# Patient Record
Sex: Female | Born: 1960 | Race: White | Hispanic: No | Marital: Married | Smoking: Never smoker
Health system: Western US, Academic
[De-identification: ages and names within clinical notes are randomized; demographics above are authoritative.]

## PROBLEM LIST (undated history)

## (undated) DIAGNOSIS — E079 Disorder of thyroid, unspecified: Secondary | ICD-10-CM

## (undated) DIAGNOSIS — E119 Type 2 diabetes mellitus without complications: Secondary | ICD-10-CM

## (undated) DIAGNOSIS — R42 Dizziness and giddiness: Secondary | ICD-10-CM

## (undated) HISTORY — PX: HYSTERECTOMY, ABDOMINAL, OPEN: SHX000826

---

## 2015-09-10 ENCOUNTER — Inpatient Hospital Stay
Admission: EM | Admit: 2015-09-10 | Discharge: 2015-09-12 | DRG: 251 | Disposition: A | Discharge status: Home / Self Care | Payer: No Typology Code available for payment source | Attending: Surgery | Admitting: Surgery

## 2015-09-10 DIAGNOSIS — E119 Type 2 diabetes mellitus without complications: Secondary | ICD-10-CM | POA: Diagnosis present

## 2015-09-10 DIAGNOSIS — K819 Cholecystitis, unspecified: Secondary | ICD-10-CM | POA: Insufficient documentation

## 2015-09-10 DIAGNOSIS — G4733 Obstructive sleep apnea (adult) (pediatric): Secondary | ICD-10-CM | POA: Diagnosis present

## 2015-09-10 DIAGNOSIS — R1011 Right upper quadrant pain: Principal | ICD-10-CM | POA: Diagnosis present

## 2015-09-10 DIAGNOSIS — Z6830 Body mass index (BMI) 30.0-30.9, adult: Secondary | ICD-10-CM | POA: Insufficient documentation

## 2015-09-10 DIAGNOSIS — E669 Obesity, unspecified: Secondary | ICD-10-CM | POA: Diagnosis present

## 2015-09-10 DIAGNOSIS — Z90710 Acquired absence of both cervix and uterus: Secondary | ICD-10-CM | POA: Insufficient documentation

## 2015-09-10 LAB — BASIC METABOLIC PANEL
CALCIUM: 9.6 mg/dL (ref 8.6–10.5)
CARBON DIOXIDE TOTAL: 20 meq/L — AB (ref 24–32)
CHLORIDE: 108 meq/L (ref 95–110)
CREATININE BLOOD: 0.88 mg/dL (ref 0.44–1.27)
GLUCOSE: 134 mg/dL — AB (ref 70–99)
POTASSIUM: 3.7 meq/L (ref 3.3–5.0)
SODIUM: 137 meq/L (ref 135–145)
UREA NITROGEN, BLOOD (BUN): 18 mg/dL (ref 8–22)

## 2015-09-10 LAB — HEPATIC FUNCTION PANEL
ALANINE TRANSFERASE (ALT): 28 U/L (ref 5–54)
ALBUMIN: 4 g/dL (ref 3.4–4.8)
ALKALINE PHOSPHATASE (ALP): 66 U/L (ref 35–115)
ASPARTATE TRANSAMINASE (AST): 22 U/L (ref 15–43)
BILIRUBIN TOTAL: 0.4 mg/dL (ref 0.3–1.3)
PROTEIN: 7.2 g/dL (ref 6.3–8.3)

## 2015-09-10 LAB — CBC WITH DIFFERENTIAL
BASOPHILS % AUTO: 1.2 %
BASOPHILS ABS AUTO: 0.1 10*3/uL (ref 0–0.2)
EOSINOPHIL % AUTO: 1.9 %
EOSINOPHIL ABS AUTO: 0.2 10*3/uL (ref 0–0.5)
HEMATOCRIT: 41.2 % (ref 36–46)
HEMOGLOBIN: 14.1 g/dL (ref 12.0–16.0)
LYMPHOCYTE ABS AUTO: 4 10*3/uL (ref 1.0–4.8)
LYMPHOCYTES % AUTO: 43.5 %
MCH: 29.6 pg (ref 27–33)
MCHC: 34.1 % (ref 32–36)
MCV: 86.7 UM3 (ref 80–100)
MONOCYTES % AUTO: 7.4 %
MONOCYTES ABS AUTO: 0.7 10*3/uL (ref 0.1–0.8)
MPV: 8.4 UM3 (ref 6.8–10.0)
NEUTROPHIL ABS AUTO: 4.2 10*3/uL (ref 1.80–7.70)
NEUTROPHILS % AUTO: 46 %
PLATELET COUNT: 314 10*3/uL (ref 130–400)
RDW: 13.9 U (ref 0–14.7)
RED CELL COUNT: 4.75 10*6/uL (ref 4.0–5.2)
WHITE BLOOD CELL COUNT: 9.2 10*3/uL (ref 4.5–11.0)

## 2015-09-10 LAB — LIPASE: LIPASE: 28 U/L (ref 13–51)

## 2015-09-10 NOTE — ED Triage Note (Signed)
Pt presents to ED with complaints of epigastric abd pain with nausea since yesterday. Pt reports pain is getting worse today. Primary [place]ian speaking, here with daughter that is translating. Pt denies bowel or bladder dysfunction. Pt states that the pain is worse after eating. Reports pain 8/10.

## 2015-09-10 NOTE — ED Initial Note (Signed)
EMERGENCY DEPARTMENT PHYSICIAN NOTE - Michele Chung       Date of Service:   09/10/2015 10:16 PM Patient's PCP: Michele Chung   Note Started: 09/10/2015 23:56 DOB: Sep 24, 1960             Chief Complaint   Patient presents with    Abd Pain Active       The history provided by the patient.  Interpreter used: Yes Other- daughter    Michele Chung is a 55yr old female, with a past medical history significant for DM, arrhythmia, hx hysterectomy, who presents to the ED with a chief complaint of abdominal pain that began yesterday. The patient reports sudden onset of abdominal pain yesterday, which returned today. Pain is gradually worsening, constant, epigastric, radiating to R side, worse with eating, better with nothing. Complains of associated nausea and slight constipation. Denies fever, chills, vomiting, cough, shortness of breath, dysuria. Takes Atorvastatin, not on diabetes medication as she is managing her diet.     A full history, including pertinent past medical and social history was reviewed.    HISTORY:  1    *Cholecystitis     No Known Allergies   No past medical history on file. Past Surgical History:  No date: Hysterectomy, abdominal, open   Social History    Marital status: MARRIED             Spouse name:                       Years of education:                 Number of children:               Occupational History    None on file    Social History Main Topics    Smoking status: Not on file                          Smokeless status: Not on file                       Alcohol use: Not on file     Drug use: Not on file     Sexual activity: Not on file          Other Topics            Concern    None on file    Social History Narrative    None on file     No family history on file.             Review of Systems   Constitutional: Negative for chills and fever.   HENT: Negative for rhinorrhea and sore throat.    Eyes: Negative for visual disturbance.   Respiratory: Negative for cough and shortness  of breath.    Cardiovascular: Negative for chest pain and leg swelling.   Gastrointestinal: Positive for abdominal pain, constipation and nausea. Negative for diarrhea and vomiting.   Genitourinary: Negative for dysuria.   Musculoskeletal: Negative for back pain.   Skin: Negative for rash.   Neurological: Negative for headaches.   All other systems reviewed and are negative.      TRIAGE VITAL SIGNS:  Temp: 36.7 C (98.1 F) (09/10/15 2220)  Temp src: Oral (09/10/15 2220)  Pulse: 102 (09/10/15 2220)  BP: (!) 177/110 (09/10/15 2220)  Resp: 18 (09/10/15 2220)  SpO2:  99 % (09/10/15 2220)  Weight: 87.8 kg (193 lb 9 oz) (09/10/15 2216)    Physical Exam   Constitutional: She is oriented to person, place, and time. She appears well-developed and well-nourished. No distress.   HENT:   Head: Normocephalic and atraumatic.   Mouth/Throat: Oropharynx is clear and moist.   Eyes: EOM are normal. Pupils are equal, round, and reactive to light.   Neck: Normal range of motion. Neck supple.   Cardiovascular: Normal rate, regular rhythm, normal heart sounds and intact distal pulses.  Exam reveals no gallop and no friction rub.    No murmur heard.  Pulmonary/Chest: Effort normal and breath sounds normal. She has no wheezes. She has no rales.   Abdominal: Soft. Bowel sounds are normal. There is tenderness in the right upper quadrant.   Musculoskeletal: Normal range of motion. She exhibits no edema.   Neurological: She is alert and oriented to person, place, and time.   Skin: Skin is warm and dry. She is not diaphoretic.   Nursing note and vitals reviewed.    INITIAL ASSESSMENT & PLAN, MEDICAL DECISION MAKING, ED COURSE  Michele Chung is a 55yr female who presents with a chief complaint of abdominal pain.     Differential includes, but is not limited to: gallstone, cholecystitis, pyelonephritis, gastritis    The results of the ED evaluation were notable for the following:    Pertinent lab results:   Labs Reviewed   BASIC METABOLIC PANEL  - Abnormal; Notable for the following:        Result Value    CARBON DIOXIDE TOTAL 20 (*)     GLUCOSE 134 (*)     All other components within normal limits   CBC WITH DIFFERENTIAL   HEPATIC FUNCTION PANEL   LIPASE   URINALYSIS AND CULTURE IF IND   CULTURE SURVEILLANCE, MRSA   POC GLUCOSE       Pertinent imaging results (reviewed and interpreted independently by me):   US ABDOMEN, LIMITED  CT ABDOMEN + PELVIS WITH CONTRAST    Impression: suspicious for cholecystitis on CT scan    Radiology reads:   US ABDOMEN, LIMITED  Diffusely echogenic liver consistent with hepatic steatosis and/or  intrinsic liver disease.  No evidence of cholelithiasis or cholecystitis.    CT ABDOMEN + PELVIS WITH CONTRAST    Diffuse gallbladder wall thickening with mild pericholecystic stranding.  Findings raise concern for cholecystitis though the ultrasound findings  were equivocal. Consider further evaluation with HIDA.    Medications:   Medications   Docusate (COLACE) Capsule 100 mg (not administered)   Acetaminophen (TYLENOL) Tablet 650 mg (not administered)   Ondansetron (ZOFRAN) Injection 4 mg (not administered)   Hydromorphone (DILAUDID) Injection 0.2-0.6 mg (not administered)   Ceftriaxone (ROCEPHIN) 2 g in NaCl 0.9% 100 mL Mini-Bag Plus IVPB (not administered)   Metronidazole (FLAGYL) 500 mg in Iso-Osmotic NaCl 100 mL IVPB (not administered)   D5 / 0.45% NaCl w/ Potassium Chloride 20 mEq/L Infusion (not administered)   Glucose Chewable Tablet 12 g (not administered)   Glucose Chewable Tablet 24 g (not administered)   Dextrose 50% Injection 12.5 g (not administered)   Dextrose 50% Injection 25 g (not administered)   Glucagon Injection 1 mg (not administered)   Insulin Regular Injection 1-6 Units (not administered)   Fentanyl (SUBLIMAZE) Injection 50 mcg (50 mcg IV Given 09/11/15 0042)   NaCl 0.9% Bolus 1,000 mL (0 mL IV Stopped 09/11/15 0230)   Iohexol (OMNIPAQUE) 350 mg/mL  Injection 125 mL (125 mL IV Given 09/11/15 0256)     Consults: A  consult was obtained from the ACS service to evaluate for cholecystitis. They recommend admission.        Chart Review: I reviewed the patient's prior medical records. Pertinent information that is relevant to this encounter includes n/a.    Patient Summary: 55 yo female presenting with RUQ pain and nausea. Hypertensive but otherwise with normal vital signs. Labs unremarkable. CT suspicious for cholecystitis. Treated with ivf and pain medication. Admitted to [facility] for further care. Hemodynamically stable in ED.    LAST VITAL SIGNS:  Temp: 36.9 C (98.4 F) (06/19 0522)  Temp src: Oral (06/19 0522)  Pulse: 82 (06/19 0522)  BP: 144/101 (06/19 0522)  Resp: 18 (06/18 2220)  SpO2: 98 % (06/19 0522)  Height: 170.2 cm ( ) (06/18 2216)  Weight: 87.8 kg (193 lb 9 oz) (06/18 2216)     Clinical Impression:     ICD-10-CM    1. Cholecystitis K81.9 ADMIT TO INPATIENT     ADMIT TO INPATIENT       Disposition: Admit    PATIENT'S GENERAL CONDITION:  Fair: Vital signs are stable and within normal limits. Patient is conscious but may be uncomfortable. Indicators are favorable.    SCRIBE STATEMENT  I, Anne Pywell, SCRIBE,  am personally taking down the notes in the presence of Dr. Vickki Hearing Olafsdottir-Gardali.  Electronically signed by Thurston Hole Pywell, SCRIBE, Scribe  09/11/2015  00:01      SCRIBE DISCLAIMER   I, Oralia Rud, personally performed the services described in this documentation, as scribed by the trained medical scribe above in my presence, and it is both accurate and complete.     Electronically signed by: Oralia Rud, Resident      This patient was seen, evaluated, and care plan was developed with the resident.  I agree with the findings and plan as outlined in our combined note. I personally independently visualized the images and tracings as noted above.      Romona Curls, MD      Electronically signed by: Romona Curls, MD, Attending Physician

## 2015-09-11 ENCOUNTER — Emergency Department (EMERGENCY_DEPARTMENT_HOSPITAL): Payer: No Typology Code available for payment source

## 2015-09-11 ENCOUNTER — Inpatient Hospital Stay (EMERGENCY_DEPARTMENT_HOSPITAL): Payer: No Typology Code available for payment source

## 2015-09-11 DIAGNOSIS — I10 Essential (primary) hypertension: Secondary | ICD-10-CM

## 2015-09-11 DIAGNOSIS — K760 Fatty (change of) liver, not elsewhere classified: Secondary | ICD-10-CM

## 2015-09-11 DIAGNOSIS — R938 Abnormal findings on diagnostic imaging of other specified body structures: Secondary | ICD-10-CM

## 2015-09-11 DIAGNOSIS — R1011 Right upper quadrant pain: Secondary | ICD-10-CM

## 2015-09-11 DIAGNOSIS — G4733 Obstructive sleep apnea (adult) (pediatric): Secondary | ICD-10-CM

## 2015-09-11 DIAGNOSIS — K819 Cholecystitis, unspecified: Secondary | ICD-10-CM | POA: Insufficient documentation

## 2015-09-11 LAB — URINALYSIS AND CULTURE IF IND
BILIRUBIN URINE: NEGATIVE
GLUCOSE URINE: NEGATIVE mg/dL
KETONES: NEGATIVE mg/dL
LEUK. ESTERASE: NEGATIVE
NITRITE URINE: NEGATIVE
OCCULT BLOOD URINE: NEGATIVE mg/dL
PH URINE: 5.5 (ref 4.8–7.8)
PROTEIN URINE: NEGATIVE mg/dL
SPECIFIC GRAVITY: 1.013 (ref 1.002–1.030)
UROBILINOGEN.: NEGATIVE mg/dL (ref ?–2.0)

## 2015-09-11 MED ORDER — DEXTROSE 50 % IN WATER (D50W) INTRAVENOUS SYRINGE
25.0000 g | INJECTION | INTRAVENOUS | Status: DC | PRN
Start: 2015-09-11 — End: 2015-09-12

## 2015-09-11 MED ORDER — DEXTROSE 50 % IN WATER (D50W) INTRAVENOUS SYRINGE
12.5000 g | INJECTION | INTRAVENOUS | Status: DC | PRN
Start: 2015-09-11 — End: 2015-09-12

## 2015-09-11 MED ORDER — FENTANYL (PF) 50 MCG/ML INJECTION SOLUTION
50.0000 ug | Freq: Once | INTRAMUSCULAR | Status: AC
Start: 2015-09-11 — End: 2015-09-11
  Administered 2015-09-11: 50 ug via INTRAVENOUS
  Filled 2015-09-11: qty 2

## 2015-09-11 MED ORDER — GLUCOSE 4 GRAM CHEWABLE TABLET
24.0000 g | CHEWABLE_TABLET | ORAL | Status: DC | PRN
Start: 2015-09-11 — End: 2015-09-12

## 2015-09-11 MED ORDER — ACETAMINOPHEN 325 MG TABLET
650.0000 mg | ORAL_TABLET | ORAL | Status: DC | PRN
Start: 2015-09-11 — End: 2015-09-12
  Administered 2015-09-11 (×2): 650 mg via ORAL
  Filled 2015-09-11 (×4): qty 2

## 2015-09-11 MED ORDER — INSULIN U-100 REGULAR HUMAN 100 UNIT/ML INJECTION SOLUTION
1.0000 [IU] | Freq: Four times a day (QID) | INTRAMUSCULAR | Status: DC
Start: 2015-09-11 — End: 2015-09-12

## 2015-09-11 MED ORDER — ONDANSETRON HCL (PF) 4 MG/2 ML INJECTION SOLUTION
4.0000 mg | Freq: Two times a day (BID) | INTRAMUSCULAR | Status: DC | PRN
Start: 2015-09-11 — End: 2015-09-12
  Administered 2015-09-11: 4 mg via INTRAVENOUS
  Filled 2015-09-11: qty 2

## 2015-09-11 MED ORDER — D5 / 0.45% NACL W KCL 20 MEQ/L IV SOLUTION
INTRAVENOUS | Status: DC
Start: 2015-09-11 — End: 2015-09-12
  Administered 2015-09-11 – 2015-09-12 (×3): via INTRAVENOUS

## 2015-09-11 MED ORDER — GLUCAGON (HUMAN RECOMBINANT) 1 MG SOLUTION FOR INJECTION
1.0000 mg | INTRAMUSCULAR | Status: DC | PRN
Start: 2015-09-11 — End: 2015-09-12

## 2015-09-11 MED ORDER — MORPHINE 2 MG/ML INJECTION SYRINGE
2.0000 mg | INJECTION | INTRAMUSCULAR | Status: DC | PRN
Start: 2015-09-11 — End: 2015-09-12

## 2015-09-11 MED ORDER — METRONIDAZOLE 500 MG/100 ML IN SODIUM CHLOR(ISO) INTRAVENOUS PIGGYBACK
500.0000 mg | INJECTION | Freq: Two times a day (BID) | INTRAVENOUS | Status: DC
Start: 2015-09-11 — End: 2015-09-12
  Administered 2015-09-11 (×2): 500 mg via INTRAVENOUS
  Filled 2015-09-11 (×2): qty 100

## 2015-09-11 MED ORDER — GLUCOSE 4 GRAM CHEWABLE TABLET
12.0000 g | CHEWABLE_TABLET | ORAL | Status: DC | PRN
Start: 2015-09-11 — End: 2015-09-12

## 2015-09-11 MED ORDER — NACL 0.9% IV BOLUS - DURATION REQ
1000.0000 mL | Freq: Once | INTRAVENOUS | Status: AC
Start: 2015-09-11 — End: 2015-09-11
  Administered 2015-09-11: 1000 mL via INTRAVENOUS

## 2015-09-11 MED ORDER — DOCUSATE SODIUM 100 MG CAPSULE
100.0000 mg | ORAL_CAPSULE | Freq: Two times a day (BID) | ORAL | Status: DC
Start: 2015-09-11 — End: 2015-09-12
  Administered 2015-09-11 – 2015-09-12 (×2): 100 mg via ORAL
  Filled 2015-09-11 (×2): qty 1

## 2015-09-11 MED ORDER — ENOXAPARIN 40 MG/0.4 ML SUBCUTANEOUS SYRINGE
40.0000 mg | INJECTION | SUBCUTANEOUS | Status: DC
Start: 2015-09-12 — End: 2015-09-12
  Filled 2015-09-11: qty 0.4

## 2015-09-11 MED ORDER — IOHEXOL 350 MG IODINE/ML INTRAVENOUS SOLUTION
125.0000 mL | INTRAVENOUS | Status: AC
Start: 2015-09-11 — End: 2015-09-11
  Administered 2015-09-11: 125 mL via INTRAVENOUS

## 2015-09-11 MED ORDER — NACL 0.9% MINI-BAG PLUS 100 ML
2.0000 g | INTRAVENOUS | Status: DC
Start: 2015-09-11 — End: 2015-09-12
  Administered 2015-09-11: 2 g via INTRAVENOUS
  Filled 2015-09-11: qty 2

## 2015-09-11 MED ORDER — HYDROMORPHONE 1 MG/ML INJECTION SYRINGE
0.2000 mg | INJECTION | INTRAMUSCULAR | Status: DC | PRN
Start: 2015-09-11 — End: 2015-09-11
  Administered 2015-09-11: 0.2 mg via INTRAVENOUS
  Filled 2015-09-11: qty 1

## 2015-09-11 NOTE — ED Nursing Note (Signed)
Bedside ultrasound completed.  Pt ambulated to restroom without assistance.

## 2015-09-11 NOTE — ED Nursing Note (Signed)
Report received from Mark, RN

## 2015-09-11 NOTE — Nurse Assessment (Signed)
ASSESSMENT NOTE    Note Started: 09/11/2015, 14:49     Pt received from ED at 1340. Initial assessment completed and recorded in EMR.  Orders reviewed. Plan of Care reviewed and appropriate, discussed with patient and daughter.  Roswell MinersBetty Ryley Teater, RN

## 2015-09-11 NOTE — Nurse Assessment (Signed)
Report called to The Progressive CorporationMark RN. Pt moved to A pod.

## 2015-09-11 NOTE — ED Nursing Note (Signed)
Patient admitted to D11. Documentation updated and complete. Patient report communicated via Vocera. Patient transported via hospital bed by Patient Escort. Patient belongings kept by patient.

## 2015-09-11 NOTE — Progress Notes (Addendum)
ACUTE CARE SURGERY DAILY PROGRESS NOTE     Attending:  Malen GauzeHo H Weslie Pretlow, MD    Note date and time: 09/12/2015   03:13  Date of admission:  09/10/2015 10:16 PM Length of stay:  1     Summary:  55 y/o woman with RUQ pain associated with meals.    Diagnosis  Acalculous cholecystitis vs biliary colic vs PUD    Operation/Procedure list:  none    Interval summary:  - workup for cholecystitis completely negative including HIDA  - GI needs to be consulted in AM for possible PUD  - H pylori sent    Physical exam:   General:  Speaks some english, daughter at bedside who speaks fluent russian  Pleasant.  Comfortable.  Cooperative.    Neuro:    No focal deficits.  Alert and oriented.  Pulm:    Unlabored breathing.    Card:    RRR.  GI:    Soft, nondistended, and nontender   Ext:    MAE x 4.  No bony ttp.    Most recent lab, imaging, and/or medicine values reviewed.     Last Bowel Movement: 09/10/15 (per pt report) (09/11/15 1949)      Last 24hr vitals:  Temp src: Oral (06/19 2353)  Temp:  [36.7 C (98.1 F)-37 C (98.6 F)]   Pulse:  [82-94]   BP: (136-155)/(88-104)   Resp:  [16-20]   SpO2:  [94 %-98 %]       Ins and Outs:  I/O Last 2 Completed Shifts:  In: 1000 [Crystalloid:1000]  Out: 300 [Emesis:300]        Medications:  Ceftriaxone (ROCEPHIN) 2 g in NaCl 0.9% 100 mL Mini-Bag Plus IVPB, IV, Q24H Now, Last Rate: Stopped (09/11/15 1123)  Docusate (COLACE) Capsule 100 mg, ORAL, BID  Enoxaparin (LOVENOX) Injection 40 mg, SUBCUTANEOUS, Q24H  Insulin Regular Injection 1-6 Units, SUBCUTANEOUS, Q6H  Metronidazole (FLAGYL) 500 mg in Iso-Osmotic NaCl 100 mL IVPB, IV, Q12H Now, Last Rate: 0 mg (09/11/15 1123)      Acetaminophen (TYLENOL) Tablet 650 mg, ORAL, Q4H PRN  Dextrose 50% Injection 12.5 g, IV, PRN  Dextrose 50% Injection 25 g, IV, PRN  Glucagon Injection 1 mg, IM, PRN  Glucose Chewable Tablet 12 g, ORAL, PRN  Glucose Chewable Tablet 24 g, ORAL, PRN  Morphine Injection 2-4 mg, IV, Q3H PRN  Ondansetron (ZOFRAN) Injection 4 mg, IV, Q12H  PRN           Interval Labs:  Recent labs for the past 48 hours     09/10/15 2223    SODIUM 137    POTASSIUM 3.7    CHLORIDE 108    CARBON DIOXIDE TOTAL 20*    UREA NITROGEN, BLOOD (BUN) 18    CREATININE BLOOD 0.88    GLUCOSE 134*      Recent labs for the past 48 hours     09/10/15 2223    WHITE BLOOD CELL COUNT 9.2    HEMOGLOBIN 14.1    HEMATOCRIT 41.2    PLATELET COUNT 314        No results found for this basename: INR:2,PTT:2 in the last 48 hours   Recent labs for the past 48 hours     09/10/15 2223    PROTEIN 7.2    ALBUMIN 4.0    BILIRUBIN TOTAL 0.4    ALKALINE PHOSPHATASE (ALP) 66    ASPARTATE TRANSAMINASE (AST) 22    ALANINE TRANSFERASE (ALT) 28    BILIRUBIN  DIRECT <0.1         Clinical course, assessment, and plan:    54y/o woman with RUQ pain of undetermined etiology    #RUQ Pain  - HIDA negative  - CT negative  - [place] scant pericholecystic fluid  - will consult GI this morning to work up PUD etiology      Diet clears    Nutrition:  Body Mass Index:  Body mass index is 30.32 kg/(m^2).   BMI is 30.0 to 34.9 = Class 1 Obesity  No inadequate nutrition    Lines piv    Drains none    Foley catheter:   No    DVT prophylaxis:   Chemical -  Yes    Mechanical -  Yes      Incidental findings:   none    Complications:  none    Dispo: ward care    Quitman Livings, MD  PGY-1, General Surgery  [facility]  PI#: [id number]    ACS [id number]          Trauma and Acute Care Surgery Attending Note  09/12/2015 18:21     Linking Language:  The patient was seen and examined. I reviewed and agree with the resident's assessment and plan we developed as outlined in the resident's note.      Patient feels better today.  GI medicine is evaluating patient.  May need EGD.  Possibly as outpatient basis.  On PPI.        This note was electronically signed by:  Malen Gauze, MD  Attending Physician  PI [phone]  [phone]

## 2015-09-11 NOTE — Plan of Care (Signed)
Problem: Patient Care Overview (Adult)  Goal: Plan of Care Review  Spoke with pt and family member at bedside about pt CPAP unit. Pts family member is going to bring home unit if pt doesn't get discharged today. Pt and family are aware they can use our BiPap unit.

## 2015-09-11 NOTE — Consults (Addendum)
ACUTE CARE SURGERY CONSULT  Date of ED Admission: 09/10/2015 10:16 PM    Time of Consult: 5:50am  Time of Evaluation: 5:55am   Attending: Marshall CorkPhan    Requesting Physician or Service: ED       REASON FOR CONSULTATION: acute cholecystitis    HISTORY OF PRESENT ILLNESS     89yr female presents with RUQ pain for approximately 12-14 hours prior to presentation. She has had intermittent episodes of RUQ pain every 2-3 months for a year, associated with eating. However, she started having more persistent, severe pain yesterday. She then had a fairly large meal and subsequently had severe and persistent pain, for which she came to ED. Nausea, no vomiting. No fevers. Was told she had "sandstones in gallbladder" during her pregnancy    Review of Systems:  Constitutional: No weight loss, night sweats or fevers  HEENT: No vision changes  CV: No chest pain or palpitations. No dyspnea or orthopnea. Can walk   Pulm: No shortness of breath, wheezing or stridor  Skin: No skin lesions or jaundice  GI: See HPI  GU: No hematuria, dysuria  Heme: No swollen lymph nodes. No history of bleeding problems or blood clots  Infectious: No recent sick contacts or travel  Endocrine: No heat or cold intolerance  Musculoskeletal: No bone, muscle or joint pain  Neuro: No convulsions, seizures, or neurologic dysfunction    Past Medical History:  Diet controlled DM2  HTN  OSA  No past medical history on file.    Past Surgical History:  Csection, h ysterectomy  Past Surgical History:   Procedure Laterality Date    HYSTERECTOMY, ABDOMINAL, OPEN         Social History:   [place]ian speaking only  Social History     Social History    Marital status: MARRIED     Spouse name: N/A    Number of children: N/A    Years of education: N/A     Social History Main Topics    Smoking status: Never Smoker    Smokeless tobacco: None    Alcohol use None    Drug use: None    Sexual activity: Not Asked     Other Topics Concern    None     Social History Narrative        Family History: No known family history of bleeding or anaesthesia complications  No family history on file.    Outpatient Medications:  No current facility-administered medications on file prior to encounter.      No current outpatient prescriptions on file prior to encounter.       Inpatient Medications:  Ceftriaxone (ROCEPHIN) 2 g in NaCl 0.9% 100 mL Mini-Bag Plus IVPB, IV, Q24H Now, Last Rate: Stopped (09/11/15 1123)  Docusate (COLACE) Capsule 100 mg, ORAL, BID  Insulin Regular Injection 1-6 Units, SUBCUTANEOUS, Q6H  Metronidazole (FLAGYL) 500 mg in Iso-Osmotic NaCl 100 mL IVPB, IV, Q12H Now, Last Rate: Stopped (09/11/15 1123)      D5 / 0.45% NaCl w/ Potassium Chloride 20 mEq/L, , IV, CONTINUOUS, Last Rate: 125 mL/hr at 09/11/15 1345      Acetaminophen (TYLENOL) Tablet 650 mg, ORAL, Q4H PRN  Dextrose 50% Injection 12.5 g, IV, PRN  Dextrose 50% Injection 25 g, IV, PRN  Glucagon Injection 1 mg, IM, PRN  Glucose Chewable Tablet 12 g, ORAL, PRN  Glucose Chewable Tablet 24 g, ORAL, PRN  Hydromorphone (DILAUDID) Injection 0.2-0.6 mg, IV, Q4H PRN  Ondansetron (ZOFRAN) Injection 4 mg, IV,  Q12H PRN        Allergies  No Known Allergies    VITAL SIGNS   Temp: 36.8 C (98.3 F)  BP: (!) 150/94  Pulse: 94  Resp: 18  SpO2: 95 %      Weight: 87.8 kg (193 lb 9 oz) Body mass index is 30.32 kg/(m^2).    PHYSICAL EXAM  Gen: alert, oriented, appears comfortable  Head: Atraumatic  Eyes: tracks movement appropriately, anicteric sclera  Neck: trachea midline  CV: normal rate, regular rhythm  Pulm: breathing comfortably on room air  GI: Abdomen soft, non-tender except right upper quadrant, +murphys, non-distended, scars well healed (laparoscopic and midline suprapubic)  Neuro, no focal deficits  MSK: moving all extremities, minimal edema    LAB TESTS  Recent labs for the past 72 hours     09/10/15 2223    WHITE BLOOD CELL COUNT 9.2    HEMOGLOBIN 14.1    HEMATOCRIT 41.2    PLATELET COUNT 314    APTT --    INR --      Recent labs for the past 72 hours     09/10/15 2223    SODIUM 137    POTASSIUM 3.7    CHLORIDE 108    CARBON DIOXIDE TOTAL 20*    UREA NITROGEN, BLOOD (BUN) 18    CREATININE BLOOD 0.88    GLUCOSE 134*    CALCIUM 9.6    MAGNESIUM (MG) --    PHOSPHORUS (PO4) --     POC Glucose, blood:  [93 mg/dl-97 mg/dl]    HEPATIC FUNCTION PANEL   Recent labs for the past 72 hours     09/10/15 2223    PROTEIN 7.2    ALBUMIN 4.0    BILIRUBIN TOTAL 0.4    ALKALINE PHOSPHATASE (ALP) 66    ASPARTATE TRANSAMINASE (AST) 22    ALANINE TRANSFERASE (ALT) 28    BILIRUBIN DIRECT &lt;0.1        IMAGING STUDIES  Ultrasound: no gallstones, no GBW thickening or fluid, normal CBD  CT a/p: thickened GBW with pericholecystic fluid, distended GB    ASSESSMENT  55 year old with clinical history and exam consistent with biliary colic presenting or acute cholecystitis. However, no stones are imaging. Possible acalculous cholecystitis. Other ddx includes gastric ulcers as pain is worse with eating    RECOMMENDATION  ACS  Abx, npo, ivf  HIDA    This consultation was performed under the supervision of Dr. Marshall Cork, Acute Care Surgery attending on call, and the impression and plan were determined jointly.    Thank you for the opportunity to participate in this patient's care.    Electronically signed at 15:58 on 09/11/2015 by:  Pieter Partridge, MD  General Surgery, PGY-3  PI: [phone]  Surgical Consult Pager: [phone]        Trauma and Acute Care Surgery Attending History and Physical  09/11/2015 19:45     The history and physical examination of the Trauma and Emergency Surgery Resident and relevant laboratory and imaging findings have been reviewed.  I have personally seen the patient and selected aspects of the history and physical examination have been repeated and performed by me.  I have discussed the findings and the plan with the resident and nurse practitioner team and we have jointly developed the following plan of care.    Pain has improved  since admission.  US shows no stones.  HIDA is negative for cholecystitis.  Etiology of epigastric pain is unclear.  Will ask GI to evaluate for possible gastritis/ulcer.  UA is negative.  Lipase normal.             This note was electronically signed by:  Malen Gauze, MD  Attending Physician  PI [phone]  [phone]

## 2015-09-11 NOTE — Nurse Assessment (Deleted)
Pt brought from up from with blood on self. Pt with a lac to head. Pt wandering around, Asking for doctor, needing to go pee. Pt asked to stay in room so he can be evaluated.

## 2015-09-11 NOTE — ED Nursing Note (Signed)
Bedside ultrasound in progress.

## 2015-09-11 NOTE — Nurse Assessment (Addendum)
ASSESSMENT NOTE    Note Started: 09/11/2015, 19:49     Initial assessment completed and recorded in EMR.  Report received from day shift nurse and orders reviewed. Plan of Care reviewed and appropriate, discussed with patient and daughter.  Thereasa SoloJoyce Taitum Alms, RN RN

## 2015-09-11 NOTE — Discharge Summary (Signed)
ACUTE CARE SURGERY DISCHARGE SUMMARY  Date of Admission:   09/10/2015 10:16 PM Date of Discharge 09/12/2015   Admitting  Service: Acute Care Surgery Discharging Service: (A) Acute Care Surgery    Attending Physician at time of Discharge: Malen GauzeHo H Phan, MD        Discharge Diagnosis:  Abdominal pain    Medications at time of Discharge:  Current Discharge Medication List      START taking these medications    Details   Ondansetron (ZOFRAN, AS HYDROCHLORIDE,) 4 mg Tablet Take 1 tablet by mouth every 8 hours if needed.  Qty: 10 tablet, Refills: 0      Oxycodone (ROXICODONE) 5 mg Tablet Take 1 tablet by mouth every 4 hours if needed.  Qty: 10 tablet, Refills: 0      Pantoprazole (PROTONIX) 40 mg Delayed Release Tablet Take 1 tablet by mouth every morning before a meal.  Qty: 30 tablet, Refills: 2         CONTINUE these medications which have NOT CHANGED    Details   Atorvastatin (LIPITOR) 80 mg tablet Take 80 mg by mouth every day.           Operations:  none    Procedure(s) Performed:  none    Imaging:   US ABDOMEN, LIMITED  CT ABDOMEN + PELVIS WITH CONTRAST  NM HEPATOBILIARY IMAGING     Complications/Infections:  None    ("X" in the box indicates presence of condition during hospitalization)   ALI/ARDS    Cardiac arrest    Decubitus ulcer    Drug or alcohol withdrawal    MI    PNA    Unplanned intubation    Stroke/CVA    Graft/Prosthesis/Flap failure    Acute Kidney Injury (Dialysis)    Surgical site infection    Unplanned return to OR    UTI    Osteomyelitis    DVT    Unplanned admission to ICU    Sepsis    Catheter related bloodstream infection    PE    Initially missed extremity compartment syndrome     Consultation(s):   GASTROENTEROLOGY CONSULT    Reason for Admission and Brief HPI:   Michele Chung is a [age]yr-old woman admitted to the Acute Care Surgery service after bringing herself to the ED after acute onset of 12 hours of mid epigastric, RUQ pain.      Hospital Course:   Admitted to ACS on 09/10/2015.  Please see  admission H&P as well as all relevant progress notes.  Hospital course by issue:    Abdominal pain, uncertain etiology  - Abdominal US negative, CT abdomen/pelvis negative, HIDA negative, urine negative  - no leukocytosis, no fevers  - started on Protonix which will continue as outpatient  - GI consulted given no biliary source of pain identified, recommend outpatient EGD  - follow up with PCP for further GI workup    She was given Lovenox for prevention of VTE. She has been ambulating without difficulty.     Incidentals from Imaging: none    Disposition:  Medically stable for discharge to home    Vital Signs:  Current Vitals  Temp: 36.7 C (98.1 F)  BP: (!) 146/98  Pulse: 98  Resp: 18  SpO2: 95 %      Weight: 87.8 kg (193 lb 9 oz)     Discharge Instructions:    DISCHARGE DIET   Discharge Diet: Regular (no restrictions)      DISCHARGE  FLUID INSTRUCTIONS   Discharge Fluid Instructions: No restrictions      DISCHARGE ACTIVITY RESTRICTIONS   Discharge Activity Restrictions: No restrictions          Recommended Follow Up Appointments:    Department of Gastroenterology  [address]  [place] [address]  [phone]  In 2 weeks  Follow up of your abdominal pain    Herma Ard, MD  [address]  [address]  [place] [address]  [phone]    In 1 week  For follow up of abdominal pain, coordination of GI workup     Scheduled Appointments:    No future appointments.     Comments to Outpatient Physician:  Please follow up on H pylori results, GI recommendations    Electronically signed by:  Erick Alley, ACNP  Trauma/Acute Care Surgery Service  PI# [phone]    Total time spent on discharge 20 minutes.     CC to:    Herma Ard, MD

## 2015-09-12 ENCOUNTER — Other Ambulatory Visit: Payer: Self-pay

## 2015-09-12 LAB — CBC WITH DIFFERENTIAL
BASOPHILS % AUTO: 0.7 %
BASOPHILS ABS AUTO: 0.1 10*3/uL (ref 0–0.2)
EOSINOPHIL % AUTO: 2.9 %
EOSINOPHIL ABS AUTO: 0.2 10*3/uL (ref 0–0.5)
HEMATOCRIT: 40.3 % (ref 36–46)
HEMOGLOBIN: 13.3 g/dL (ref 12.0–16.0)
LYMPHOCYTE ABS AUTO: 2.7 10*3/uL (ref 1.0–4.8)
LYMPHOCYTES % AUTO: 37.7 %
MCH: 28.7 pg (ref 27–33)
MCHC: 33.1 % (ref 32–36)
MCV: 86.6 UM3 (ref 80–100)
MONOCYTES % AUTO: 9.6 %
MONOCYTES ABS AUTO: 0.7 10*3/uL (ref 0.1–0.8)
MPV: 8.4 UM3 (ref 6.8–10.0)
NEUTROPHIL ABS AUTO: 3.5 10*3/uL (ref 1.80–7.70)
NEUTROPHILS % AUTO: 49.1 %
PLATELET COUNT: 270 10*3/uL (ref 130–400)
RDW: 13.9 U (ref 0–14.7)
RED CELL COUNT: 4.65 10*6/uL (ref 4.0–5.2)
WHITE BLOOD CELL COUNT: 7.2 10*3/uL (ref 4.5–11.0)

## 2015-09-12 LAB — BASIC METABOLIC PANEL
CALCIUM: 9.1 mg/dL (ref 8.6–10.5)
CARBON DIOXIDE TOTAL: 24 meq/L (ref 24–32)
CHLORIDE: 106 meq/L (ref 95–110)
CREATININE BLOOD: 0.75 mg/dL (ref 0.44–1.27)
GLUCOSE: 118 mg/dL — AB (ref 70–99)
POTASSIUM: 3.9 meq/L (ref 3.3–5.0)
SODIUM: 137 meq/L (ref 135–145)
UREA NITROGEN, BLOOD (BUN): 7 mg/dL — AB (ref 8–22)

## 2015-09-12 LAB — CULTURE SURVEILLANCE, MRSA

## 2015-09-12 MED ORDER — OXYCODONE 5 MG TABLET
1.0000 | ORAL_TABLET | Freq: Four times a day (QID) | ORAL | 0 refills | Status: AC | PRN
Start: 2015-09-12 — End: ?
  Filled 2015-09-12: qty 10, 2d supply, fill #0

## 2015-09-12 MED ORDER — ONDANSETRON HCL 4 MG TABLET
4.0000 mg | ORAL_TABLET | Freq: Three times a day (TID) | ORAL | 0 refills | Status: AC | PRN
Start: 2015-09-12 — End: 2015-10-10
  Filled 2015-09-12: qty 10, 4d supply, fill #0

## 2015-09-12 MED ORDER — PANTOPRAZOLE 40 MG TABLET,DELAYED RELEASE
40.0000 mg | DELAYED_RELEASE_TABLET | Freq: Every day | ORAL | 2 refills | Status: AC
Start: 2015-09-12 — End: 2015-10-12
  Filled 2015-09-12: qty 30, 30d supply, fill #0

## 2015-09-12 MED ORDER — PANTOPRAZOLE 40 MG TABLET,DELAYED RELEASE
40.0000 mg | DELAYED_RELEASE_TABLET | Freq: Every day | ORAL | Status: DC
Start: 2015-09-12 — End: 2015-09-12
  Administered 2015-09-12: 40 mg via ORAL
  Filled 2015-09-12: qty 1

## 2015-09-12 NOTE — Plan of Care (Signed)
Problem: Patient Care Overview (Adult)  Goal: Plan of Care Review  Outcome: Ongoing (interventions implemented as appropriate)    09/12/15 1541   OTHER   Plan Of Care Reviewed With patient;daughter   Plan of Care Review   Progress progress toward functional goals as expected   Goal Outcome Evaluation Note     Michele Chung is a [age]yr female admitted 09/10/2015      OUTCOME SUMMARY AND PLAN MOVING FORWARD:   Pt has spent the day in bed, daughters at bedside. Pt has stated she is frustrated with needing to stay in the hospital while waiting to speak with the GI. Pt has remained NPO, c/o minimal amounts of pain 2/10 in the RUQ after ambulating and a HA, not relieved by tylenol. IV fluids running.Care and treatment plans followed, no acute events. Will continue to monitor.   Goal: Individualization and Mutuality  Outcome: Ongoing (interventions implemented as appropriate)  Goal: Discharge Needs Assessment  Outcome: Ongoing (interventions implemented as appropriate)    Problem: Sleep Pattern Disturbance (Adult)  Goal: Adequate Sleep/Rest  Patient will demonstrate the desired outcomes by discharge/transition of care.   Outcome: Ongoing (interventions implemented as appropriate)    Problem: Fall Risk (Adult)  Goal: Absence of Falls  Patient will demonstrate the desired outcomes by discharge/transition of care.   Outcome: Ongoing (interventions implemented as appropriate)

## 2015-09-12 NOTE — Nurse Discharge Note (Signed)
NURSE PROGRESS NOTE    Note Started: 09/12/2015, 18:48     Data: Pt is stable for discharge to home as cleared by MD.     Action: Discharge packet/AHS given to patient.Reviewed AHS material with patient including home care and when to seek urgent medical attention. Educated patient on home PRN pain medication including the rationale against driving while taking pain medications, taking more than one kind of pain med at a time, and signs and syptoms of adverse reaction.Discussed with patient regarding maintaining normal bowel regimen as narcotic pain medications may cause constipation. Pt states all belongings returned. Belongings sent with patient and daughter.     Response: Pt stated understanding of AHS material and plan of care for discharge. Pt is discharged to home in stable condition, accompanied by daughters. Careplan resolved.     Rhea BleacherSarah O'Malley, RN, RN

## 2015-09-12 NOTE — Discharge Instructions (Signed)
MEDICATIONS   1. Resume all home medications as directed.   2. Start new discharge medications as directed.   3. You may take Tylenol for pain. Avoid all NSAIDs such as Ibuprofen, Motrin as well as Aspirin. If you have severe pain, take the narcotic called Oxycodone every 4-6 hours. Do not drive or drink alcohol with that medicine.     ACTIVITY/DIET  1. You may resume your usual level of activity.   2. You ***    RETURN   1. Follow-up with your regular doctor in the next 1-2 weeks.   2. Go to the Emergency Department near you for any symptoms such as fevers with temperature greater than 101.5 F, chills, persistent nausea and vomiting, severe pain not controlled with medications, pus drainage from the wound site, changes in mental status or for any acute problems or illnesses.  3. Please call the Trauma Nurse Practitoner pager at [phone] with questions or concerns.

## 2015-09-12 NOTE — Allied Health Procedure (Signed)
Mobility Program Data Extraction Note  (See Physical Therapy Notes for Clinical Information)        Mobility Phase Guidelines: [email or web address]    Last Documented Mobility Phase: No Data Exists    Current Phase: Phase 4    Mobility Session Initiated with Physical Therapy?: Yes    Mobility Session Completed with Physical Therapy?: Yes    Mobility Program Status: Initial Evaluation      ======================================================================       Phase I  Phase II    Command and physical response activation  Arousal/ orientation/ communication Degree of Interation: Low Cooperation    Command and verbal response activation     Patient and/or caregiver education    Arousal and orientation degree of Interaction: Comatose, Unarousable    Musculoskeletal Program: Advancement (AROM, AAROM, resistive training, metered exercise UE/LE    Musculoskeletal Program: Positioning Head to Feet: prevent subluxation, joint malalignment, manage tone, manage edema, visual-spatial orientation, PROM all limbs: proximal to distal, facilitate basic AROM  Participation in beginning components of bed mobility: Reaching, rolling, active LE      Sensorimotor Program: midline orientation, reflexes, tactile feedback  Positioning Head to Feet: prevent subluxation, joint malalignment, manage tone, manage edema, visual-spatial orientation    Caregiver education and participation as appropriate  Sensorimotor Program: visual attention, midline orientation, righting reactions    Dependent splint/orthotic application  Supported sitting EOB, cardiac chair, wheelchair    Dependent mobilization out of bed (to cardiac chair)  Mobilize out of bed: Passive Transfers Dependent through Max Assist  ?Lift Team indicated    Encourage patient participation with bed-level ADL's: Hygiene/grooming; self-feeding; upper body sponge bathing, upper body dressing  Mobilize  out of bed: Assess transfer type, level of assist, tolerance      Sitting schedule implemented all shifts      EOB: Supported, unsupported or challenged balance activities      Supported sitting exercise: metered exercise UE/LE      Pre-gait training (dependent to moderate assist)      Other mobilization: Tile Table Program      Passive Splint/Orthotic Application      Encourage patient participation with edge of bed ADL's: Hygiene/grooming; self-feeding; upper body sponge bathing/dressing; lower body sponge bathing    Phase III  Phase IV    Arousal/orientation/communication Degree of Interaction: Moderate Cooperation x Degree of interaction: High Cooperation Education for patient/family    Patient and/or cargiver education as appropriate (incorporate any appropriate activity)  Advanced Musculoskeletal Program: Resistive, metered exercise UE/LE    Musculoskeletal Program: Advancement (AROM, AAROM, resistive training metered exercise UE/LE, Object Manipulation) x Advanced bed mobility/incorporate nursing all shifts     Sensorimotor Program: proprioception feeback, coordination reactions  Sensorimotor Program: timing, skilled voluntary control of limbs, position ; direction change reactions     Caregiver education and participation x Functional transfer training (commode or chair)/incorporate nursing all shifts    Bed mobility advancement  High level balance activities    Sitting balance advancement: Static and dynamic trunk activities x Gait program or (wheelchair mobility for wheelchair-dependent only), incorporate nursing all shifts    Advance out of bed sitting tolerance  Active splint/orthotic application    Active assisted transfer training and advancement x Encourage patient participation in ADL's in bathroom setting: Use of regular toilet; ADL's at sink-side; consider use of shower stall    Standing Activities (Pre-gait): Supported/unsupported, Static/dynamic; tilt table      Gait Training/assisted gait       Active assistive splint/orthotic application        Encourage Patient participation in seated ADL Activities OOB in bedside chair/wheelchair/ cardiac chair/ bedside commode: Hygiene/grooming; self-feeding; upper body sponge bathing/ dressing; lower Body sponge bathing/ dressing; toileting       Phase D for Pediatric use    ======================================================================    Electronically signed by:     Verlaine Embry, PT  PI# [id number]  [phone]

## 2015-09-12 NOTE — Nurse Focus (Signed)
09/12/2015  1200  Pt refused BS check. RN educated pt on importance of monitoring glucose levels. Pt stated she's "fine" and opted against checking BG levels. Will continue to monitor.  Rhea BleacherSarah O'Malley, RN

## 2015-09-12 NOTE — Allied Health Progress (Signed)
Date of Service:  09/12/2015   12:56    Case Management Progress Note:    Based on chart review and/or discussion with interdisciplinary team members the following information was received:     Patient is a 55 y/o woman with RUQ pain associated with meals.  Diagnosis  Acalculous cholecystitis vs biliary colic vs PUD  Operation/Procedure list:  none  09/12/15 Interval summary:  - workup for cholecystitis completely negative including HIDA  - GI needs to be consulted in AM for possible PUD  - H pylori sent    Discharge planning will continue to follow patient and assist with developing d/c plan as patient stabilizes and discharge needs are more clearly identified.     NOK on file is Garnette GunnerSTAPOVA,OLGA (daughter) [phone]   Funding:  [employer]   PCP: Herma ArdPavel M Polskiy, MD- patient can follow-up as directed.     For any further dcp need please contact assigned CM.    Electronically Signed by:  Yvone Neulement Aeisha Minarik, RN, BSN  Clinical Case Manager  [facility]   Pager No. [phone]  E-Mail: [email or web address]

## 2015-09-12 NOTE — Consults (Addendum)
Gastroenterology Consult Note    Note started: 09/12/2015,15:50    Consultation requested by: Malen Gauze, MD  Reason for consult: abdominal pain    History of Present Illness:   Michele Chung is a 1yr female with a past medical history significant for DM, arrhythmia, hx hysterectomy, who presents to the ED with a chief complaint of abdominal pain. Her pain starting from epigastric area and radiates to her RUQ. When it started it was 8/10 and now it is 1/10. Pain is constant and was getting worse with eating. She has had similar abdominal pains in the past. Has had an EGD around 5 years ago which showed some "inflammation in the smal intestine" per patient.   She also had one episode of emesis with dark material in it. Denies any melena, hematochezia.   She has taken PPI in the past with minimal relief in her symptoms.  She has had CT scan and US abdomen that showed trace fluid around gallbladder. Had HIDA scan which was normal.   Currently she says that her pain has improved and she is willing to go home.     Review of Systems:   A complete review of systems was performed with pertinent positives mentioned above    Allergies: No Known Allergies    Inpatient Medications:   Current Facility-Administered Medications:  Acetaminophen (TYLENOL) Tablet 650 mg ORAL Q4H PRN    D5 / 0.45% NaCl w/ Potassium Chloride 20 mEq/L Infusion IV CONTINUOUS Last Rate: 75 mL/hr at 09/12/15 1022   Dextrose 50% Injection 12.5 g IV PRN    Dextrose 50% Injection 25 g IV PRN    Docusate (COLACE) Capsule 100 mg ORAL BID    Enoxaparin (LOVENOX) Injection 40 mg SUBCUTANEOUS Q24H    Glucagon Injection 1 mg IM PRN    Glucose Chewable Tablet 12 g ORAL PRN    Glucose Chewable Tablet 24 g ORAL PRN    Insulin Regular Injection 1-6 Units SUBCUTANEOUS Q6H    Ondansetron (ZOFRAN) Injection 4 mg IV Q12H PRN    Pantoprazole (PROTONIX) Delayed Release Tablet 40 mg ORAL QAM AC        Outpatient (PTA) Medications: Prescriptions Prior to Admission    Medication Sig    Atorvastatin (LIPITOR) 80 mg tablet Take 80 mg by mouth every day.       Past Medical History: No past medical history on file.    Past Surgical History:   Past Surgical History:   Procedure Laterality Date    HYSTERECTOMY, ABDOMINAL, OPEN         Social History:   History   Smoking Status    Never Smoker   Smokeless Tobacco    Not on file   , History   Alcohol use Not on file   , History   Drug Use Not on file       Family History: family history is not on file.    Vital Signs: Temp: 36.8 C (98.2 F) (06/20 1303)  Temp src: Oral (06/20 1303)  Pulse: 96 (06/20 1303)  BP: 148/88 (06/20 1303)  Resp: 18 (06/20 1303)  SpO2: 94 % (06/20 0900)  Height: 170.2 cm (5\' 7" ) (06/18 2216)  Weight: 87.8 kg (193 lb 9 oz) (06/18 2216)  Physical exam:   GENERAL: A&Ox3, NAD.  HEENT: Normocephalic, atraumatic. PERRL, EOMI. Anicteric sclera  CARDIAC: Normal rate, regular rhythm. Normal S1 and physiologically split S2.  LUNGS: CTAB  ABDOMEN: Normoactive bowel sounds.  Soft, nondistended, mild RUQ  tnderness.  No guarding or rebound.   EXTREMITIES: No significant deformity or joint abnormality. No edema.   NEUROLOGICAL: A&Ox3, no asterixis.   SKIN: No jaundice. No palmar erythema.        Patient is a candidate for moderate sedation.  Patient is ASA status:  2 - Mild, controlled systemic disease and no functional limitations    Airway Assessment:    Mallampati image    Mallampati Class:  2, Oral Eval: Mouth opening normal, Neck ROM:  full     The procedure, risks, benefits, and alternatives were explained.  All patient questions were answered.  The informed consent was signed.    Patient barriers to learning:  none    Patient/family understanding:  verbalizes      Current Labs:   CBC Recent labs for the past 72 hours     09/12/15 0516 09/10/15 2223    WHITE BLOOD CELL COUNT 7.2 9.2    HEMOGLOBIN 13.3 14.1    HEMATOCRIT 40.3 41.2    PLATELET COUNT 270 314      BASIC METABOLIC PANEL Recent labs for the past 72  hours     09/12/15 0516 09/10/15 2223    GLUCOSE 118* 134*    UREA NITROGEN, BLOOD (BUN) 7* 18    CREATININE BLOOD 0.75 0.88    SODIUM 137 137    POTASSIUM 3.9 3.7    CHLORIDE 106 108    CARBON DIOXIDE TOTAL 24 20*    CALCIUM 9.1 9.6      HEPATIC FUNCTION PANEL Recent labs for the past 72 hours     09/10/15 2223    PROTEIN 7.2    ALBUMIN 4.0    BILIRUBIN TOTAL 0.4    ALKALINE PHOSPHATASE (ALP) 66    ASPARTATE TRANSAMINASE (AST) 22    ALANINE TRANSFERASE (ALT) 28    BILIRUBIN DIRECT &lt;0.1      INR   No results found for this basename: INR:* in the last 72 hours   Lipase:   LIPASE   Date Value Ref Range Status   09/10/2015 28 13 - 51 U/L Final     Amylase: No results found for: AMY      Imaging Studies:     CT Abd/Pelvis:    Liver, gallbladder, and bile ducts: Liver unremarkable. No intrahepatic  ductal dilatation. No radiopaque gallstones are seen. There is trace  pericholecystic fluid. No surrounding fat stranding is noted. The CBD is  not dilated.    Pancreas, spleen, and adrenals: Unremarkable.    Kidneys, ureters, and bladder: Unremarkable. No evidence of hydronephrosis  bilaterally    Uterus and adnexa: Surgically absent.    Peritoneum and GI tract: No free air or free fluid. No dilated bowel loops.  Appendix in the right lower quadrant without evidence of appendicitis. Air  and stool are noted throughout the colon. No evidence of free fluid or free  intraperitoneal air    Vasculature and lymphatics: Scattered atheromatous calcifications. No acute  vascular abnormality. No lymphadenopathy.    Bones and soft tissues: Mild degenerative disc disease. No acute fracture  or dislocation. No focal soft tissue abnormality.    IMPRESSION:      1. Trace pericholecystic fluid as noted on ultrasound.  2. No other acute process.    Abdominal Ultrasound:   1. Diffusely hepatic steatosis.  2. Mildly distended gallbladder and trace pericholecystic fluid. No  focal tenderness over the gallbladder. The ultrasound  findings are  equivocal, but considered along  with the absence of leukocytosis, acute  cholecystitis is considered unlikely.    HIDA scan   NORMAL HEPATOBILIARY SCINTIGRAPHY.    Impression: Michele Chung is a [age]yr female with a past medical history significant for DM, arrhythmia, hx hysterectomy, who presents to the ED with a chief complaint of abdominal pain. She has had CT scan and US abdomen that showed trace fluid around gallbladder. Had HIDA scan which was normal. Has had normal lipase as well. EGD a few years ago showed duodenitis per patient. It is possible that she has gastritis, duodenitis or PUD which is causing her pain. She would benefit from an upper endoscopy to investigate the above possibilities.     Recommendations:   -EGD tomorrow  -keep NPO  -PPI BID  - avoid NSAIDs and gastrotoxic medications.     Patient was discussed with GI attending, Dr. Earlean ShawlShiro Jenavee Laguardia    Thank you for allowing us to participate in the care of this patient. Please do not hesitate to contact us with any further questions.     Collene MaresAmir Taefi, MD (PGY-4  Clinical Fellow)  [facility]   Department of Internal Medicine  Division of Gastroenterology and Hepatology   [phone]    ATTENDING NOTE:    Patient was seen and examined as per request of Dr. Marshall CorkPhan for evaluation of abdominal pain. Case was reviewed and discussed with Dr. Chelsea Ausaefi and we examined and I agree with the evaluation, assessment including the past medical/family/social histories and ROS as noted and the recommendation we developed as outlined.     Earlean ShawlShiro Jocob Dambach, M.D.  Professor of Medicine  Division of Gastroenterology & Hepatology    Electronically signed by Earlean ShawlShiro Edman Lipsey, MD  Attending Gastroenterology

## 2015-09-12 NOTE — Plan of Care (Signed)
Problem: Patient Care Overview (Adult)  Goal: Plan of Care Review  Outcome: Ongoing (interventions implemented as appropriate)  Goal Outcome Evaluation Note     Michele Chung is a 55yrfemale admitted 09/10/2015      OUTCOME SUMMARY AND PLAN MOVING FORWARD:   No acute changes in status. No complaints of nausea or vomiting even after finishing 100% of clear liquid dinner last night. Complained of headache during the night, Tylenol given with relief. Verbalizes desire to go home. Daughter stayed at bedside all night.        Problem: Sleep Pattern Disturbance (Adult)  Goal: Identify Related Risk Factors and Signs and Symptoms  Related risk factors and signs and symptoms are identified upon initiation of Human Response Clinical Practice Guideline (CPG)   Outcome: Outcome(s) achieved Date Met:  09/12/15    09/12/15 0543   Sleep Pattern Disturbance   Sleep Pattern Disturbance: Related Risk Factors unfamiliar surroundings;hospital routine/care   Signs and Symptoms (Sleep Pattern Disturbance) difficulty performing routine tasks       Goal: Adequate Sleep/Rest  Patient will demonstrate the desired outcomes by discharge/transition of care.   Outcome: Ongoing (interventions implemented as appropriate)    Problem: Fall Risk (Adult)  Goal: Identify Related Risk Factors and Signs and Symptoms  Related risk factors and signs and symptoms are identified upon initiation of Human Response Clinical Practice Guideline (CPG)   Outcome: Outcome(s) achieved Date Met:  09/12/15    09/12/15 0543   Fall Risk   Fall Risk: Related Risk Factors environment unfamiliar;fatigue/slow reaction   Fall Risk: Signs and Symptoms presence of risk factors       Goal: Absence of Falls  Patient will demonstrate the desired outcomes by discharge/transition of care.   Outcome: Ongoing (interventions implemented as appropriate)

## 2015-09-12 NOTE — Nurse Assessment (Signed)
ASSESSMENT NOTE    Note Started: 09/12/2015, 09:14     Initial assessment completed and recorded in EMR.  Report received from night shift nurse and orders reviewed. Plan of Care reviewed and updated, discussed with patient and family.  Rhea BleacherSarah O'Malley, RN RN

## 2015-09-12 NOTE — Allied Health Consult (Signed)
[facility] -- ACUTE CARE SERVICE      PHYSICAL THERAPY EVALUATION     Name: Michele Chung   MRN: [id number]   Date of Service: 09/12/2015  Time In: 10:30    Total Time: 20 Minutes                                                                                                                                                  INTAKE INFORMATION AND HISTORY:                       Therapy Consult(s) Ordered: Physical Therapy  Primary Service: (A) Acute Care Surgery   Date of Admission: 09/10/2015  Date of Onset (Medicare only):    Diagnosis: cholecystitis  Language: Russian; daughter present throughout session to translate    Precautions: None    History of Present Illness/Injury (Including pertinent test results & procedures):    Per EMR, 55 y/o woman with RUQ pain associated with meals.    Diagnosis  Acalculous cholecystitis vs biliary colic vs PUD    Operation/Procedure list:  none    Interval summary:  - workup for cholecystitis completely negative including HIDA  - GI needs to be consulted in AM for possible PUD  - H pylori sent      Past Medical/Surgical History:    No past medical history on file.  Past Surgical History:   Procedure Laterality Date    HYSTERECTOMY, ABDOMINAL, OPEN             Social History:    Social History   Substance Use Topics    Smoking status: Never Smoker    Smokeless tobacco: None    Alcohol use None           SUBJECTIVE EXAM:    Current Living Situation: With family/friends  Support at Time of Discharge:  Husband and children  Environment at Discharge:  Single level home  Environmental Barriers:  No entry steps  Assistive Devices Owned: no assistive device  Adaptive Equipment Owned: N/A  Prior Level of Function: Ambulating independently without assistive device  Independent with all ADLS  Mental Status: Alert and oriented x4    Pain Level / Chief Complaint: 2/10 pain abdomen when walking or moving      OBJECTIVE EXAMINATION:    Observation: pleasant and cooperative female patient lying  supine in bed, daughter present to translate; (+) IV     Extremity Status: WFL       ROM MMT Sensation Tone Coordination   RUE        LUE        RLE        LLE        Comments: no deficits noted on both UE/LE     Demonstrated Functional Activities:   Key: Dep=Dependent       Max=Maximal     Mod=Moderate     Min=Minimal     CG=Contact Guard     SB=Stand-By     S=Supervised     I=Independent     NA=Not Applicable     NT=Not Tested     N=Normal     G=Good     F=Fair     P=Poor     U=Unable     FWW=Front-Wheeled Walker     AC=Axillary Crutches     SPC=Single-Point Cane     Level of Assistance Dep Max Mod Min CG SB S I NA NT Comments   Rolling          x      Scooting          x      Sidelying/ Supine to Sit        x      Transfer Sit to Stand        x      Transfer Bed to Chair        x      Gait          x   250 feet without AD   Up/Down Step/Stairs         x     Comments: patient able to perform all of her transfers and walking activity safely and independently.      Balance:       N G F P U NA NT Comments   Sitting Balance - Static  x         Sitting Balance - Dynamic  x         Standing Balance - Static  x         Standing Balance - Dynamic  x         Comments:        ASSESSMENT / RECOMMENDATIONS/ EDUCATION:    Physical Therapy Assessment and Discharge Recommendations:    Patient referred to PT for evaluation following recent admission due to cholecystitis presented with baseline mobility of independent for transfers, bed mobility and walking. She does not have a skilled PT needs and can be DC when medically stable. No anticipated needs postdischarge.    Physical Therapy Recommendations for Nursing:  Ambulate with nursing or family around the unit to tolerance.  Out of bed to chair 3 times per day.  Phase: 4      Patient / Caregiver Education Today: role of PT in DC planning, DC recommendations    Method of Teaching: demonstration and verbal     Learner: patient and family/caregiver    Response: verbalizes  understanding and able to give return demonstration    GOALS / TREATMENT PLAN / FUNCTIONAL PROGNOSIS:  Patient's Goals: go home soon    Physical Therapy Goals:    Patient will demonstrate understanding of activity recommendations for discharge  (Goal met within this session)        Prognosis:  Patient Seen One Time Only; No Additional Therapy Indicated     Treatment Plan:    N/A     Recommended Frequency of Treatment: N/A     Recommended Duration of Treatment: evaluation only    Patient / Caregiver Participation / Education   Has the plan of care been explained to the patient / caregiver? yes   Is the patient able to understand the plan of care?   yes    Does the patient / caregiver(s) agree with  the plan of care? yes    List Barriers that may interfere with plan of care:   None    Interim Report Due:  09/26/2015     Patient seen for additional physical therapy treatment; see 09/12/2015 physical therapy progress note for details.    x   No additional treatment rendered this encounter.     Reported by:  Harvel Ricks, PT  PI# [id number]  [phone]

## 2015-09-13 LAB — HELICOBACTER PYLORI AB, IGG: HELICOBACTER PYLORI AB, IGG: 8.27 {index}

## 2015-09-19 ENCOUNTER — Telehealth: Payer: Self-pay | Admitting: Surgery

## 2015-09-19 NOTE — Telephone Encounter (Signed)
Post-discharge follow up Phone Call    Provider: Dr. Leonie DouglasPhan    Michele Chung is a [age]yr old female.     Abdominal pain    Per: patient. Three patient identifiers used.    patient called manually from ureached patients in [facility] platform. Patient  does not haven any issues at this time, understands discharge instructions, taking medications as prescribed, and has follow up appointments scheduled. Nor further concerns at this time.     Per: patient verbalizes agreement to plan    Time Spent to Close: 10 minutes    Michele Elliotiana Grachuk, RN, BSN  [facility] Post-[facility]charge [employer] [employer] [facility]   [phone]

## 2018-05-10 ENCOUNTER — Emergency Department (EMERGENCY_DEPARTMENT_HOSPITAL): Payer: No Typology Code available for payment source

## 2018-05-10 ENCOUNTER — Emergency Department
Admission: EM | Admit: 2018-05-10 | Discharge: 2018-05-11 | Disposition: A | Discharge status: Home / Self Care | Payer: No Typology Code available for payment source

## 2018-05-10 DIAGNOSIS — E079 Disorder of thyroid, unspecified: Secondary | ICD-10-CM | POA: Insufficient documentation

## 2018-05-10 DIAGNOSIS — R509 Fever, unspecified: Secondary | ICD-10-CM

## 2018-05-10 DIAGNOSIS — R05 Cough: Secondary | ICD-10-CM

## 2018-05-10 DIAGNOSIS — J101 Influenza due to other identified influenza virus with other respiratory manifestations: Principal | ICD-10-CM | POA: Insufficient documentation

## 2018-05-10 DIAGNOSIS — E119 Type 2 diabetes mellitus without complications: Secondary | ICD-10-CM | POA: Insufficient documentation

## 2018-05-10 DIAGNOSIS — R059 Cough, unspecified: Secondary | ICD-10-CM

## 2018-05-10 HISTORY — DX: Dizziness and giddiness: R42

## 2018-05-10 HISTORY — DX: Disorder of thyroid, unspecified: E07.9

## 2018-05-10 HISTORY — DX: Type 2 diabetes mellitus without complications: E11.9

## 2018-05-10 LAB — URINALYSIS AND CULTURE IF IND
BILIRUBIN URINE: NEGATIVE
GLUCOSE URINE: NEGATIVE mg/dL
KETONES: NEGATIVE mg/dL
LEUK. ESTERASE: NEGATIVE
NITRITE URINE: NEGATIVE
PH URINE: 5 (ref 4.8–7.8)
PROTEIN URINE: NEGATIVE mg/dL
RBC: 1 /HPF (ref 0–5)
SPECIFIC GRAVITY, URINE: 1.02 (ref 1.002–1.030)
SQUAMOUS EPI: 9 /HPF — AB (ref 0–5)
UROBILINOGEN.: 2 mg/dL (ref ?–2.0)
WBC: 2 /HPF (ref 0–5)

## 2018-05-10 LAB — BLD GAS VENOUS
BASE EXCESS, VEN: -1 meq/L (ref <=2)
BASE EXCESS, VEN: -2 meq/L (ref <=2)
HCO3, VEN: 22 meq/L (ref 20–28)
HCO3, Ven: 25 meq/L (ref 20–28)
O2 SAT, VEN: 79 % (ref 70–100)
PCO2, VEN: 43 mmHg (ref 35–50)
PCO2, VEN: 36 mmHg (ref 35–50)
PH, VEN: 7.37 (ref 7.30–7.40)
PH, VEN: 7.39 (ref 7.30–7.40)
PO2, VEN: 43 mmHg (ref 30–55)

## 2018-05-10 LAB — LACTIC ACID, WHOLE BLD VENOUS
LACTIC ACID, WHOLE BLD VENOUS: 1.3 mmol/L (ref 0.9–1.7)
Lactic Acid, Whole Bld Venous: 1.2 mmol/L (ref 0.9–1.7)

## 2018-05-10 LAB — CBC WITH DIFFERENTIAL
BASOPHILS % AUTO: 1.2 %
Basophils Abs Auto: 0.1 10*3/uL (ref 0.0–0.2)
EOSINOPHIL % AUTO: 1 %
EOSINOPHIL ABS AUTO: 0 10*3/uL (ref 0.0–0.5)
HEMATOCRIT: 40.9 % (ref 36.0–46.0)
HEMOGLOBIN: 13.9 g/dL (ref 12.0–16.0)
LYMPHOCYTE ABS AUTO: 2 10*3/uL (ref 1.0–4.8)
LYMPHOCYTES % AUTO: 44.5 %
MCH: 30.1 pg (ref 27.0–33.0)
MCHC: 34 % (ref 32.0–36.0)
MCV: 88.5 fL (ref 80.0–100.0)
MONOCYTES % AUTO: 17.3 %
MONOCYTES ABS AUTO: 0.8 10*3/uL (ref 0.1–0.8)
MPV: 9 fL (ref 6.8–10.0)
NEUTROPHIL ABS AUTO: 1.6 10*3/uL — AB (ref 1.8–7.7)
NEUTROPHILS % AUTO: 36 %
PLATELET COUNT: 221 10*3/uL (ref 130–400)
RDW: 13.6 % (ref 0.0–14.7)
RED CELL COUNT: 4.62 10*6/uL (ref 4.00–5.20)
WHITE BLOOD CELL COUNT: 4.5 10*3/uL (ref 4.5–11.0)

## 2018-05-10 LAB — BASIC METABOLIC PANEL
CALCIUM: 8.8 mg/dL (ref 8.6–10.5)
CARBON DIOXIDE TOTAL: 23 mmol/L — AB (ref 24–32)
CREATININE BLOOD: 0.91 mg/dL (ref 0.44–1.27)
Chloride: 108 mmol/L (ref 95–110)
E-GFR, AFRICAN AMERICAN (FEMALE): 80 mL/min/{1.73_m2}
E-GFR, NON-AFRICAN AMERICAN (FEMALE): 69 mL/min/{1.73_m2}
GLUCOSE: 111 mg/dL — AB (ref 70–99)
POTASSIUM: 4.2 mmol/L (ref 3.3–5.0)
SODIUM: 138 mmol/L (ref 135–145)
UREA NITROGEN, BLOOD (BUN): 10 mg/dL (ref 8–22)

## 2018-05-10 LAB — POC FLU TEST: POC FLU TEST: POSITIVE — AB

## 2018-05-10 LAB — POC GLUCOSE: POC GLUCOSE: 95 mg/dL (ref 70–99)

## 2018-05-10 MED ORDER — LACTATED RINGERS IV BOLUS - DURATION REQ
1000.0000 mL | Freq: Once | INTRAVENOUS | Status: AC
Start: 2018-05-10 — End: 2018-05-10
  Administered 2018-05-10: 1000 mL via INTRAVENOUS

## 2018-05-10 MED ORDER — ACETAMINOPHEN 500 MG TABLET
1000.0000 mg | ORAL_TABLET | Freq: Once | ORAL | Status: AC
Start: 2018-05-10 — End: 2018-05-10
  Administered 2018-05-10: 1000 mg via ORAL
  Filled 2018-05-10: qty 2, fill #0

## 2018-05-10 NOTE — ED Provider Notes (Addendum)
EMERGENCY DEPARTMENT PHYSICIAN NOTE - Michele Chung       Date of Service:   05/10/2018  7:37 PM Patient's PCP: Herma Ard   Note Started: 05/10/2018 22:00 DOB: 09-03-1960         Chief Complaint   Patient presents with    Fever       The history provided by the patient.  Interpreter used: Yes Other- daughter    Michele Chung is a 58yr old female, who  has a past medical history of Diabetes mellitus (HCC), Thyroid disease, and Vertigo., presenting to the ED with a chief complaint of non-productive cough and fever that began 3 days ago. Tmax 39C. Assocaited with mild SOB with exertion, chest pain due to cough, and myalgias. Daughter states that the Patient has been having recurrent symptoms since Christmas 2019. Patient states that she has symptoms for about 1 week and then feels better for about 3 days and then her symptoms return. Patient did not receive her influenza vaccine this year. Patient states she was never diagnosed with asthma, however she was prescribed an inhaler for wheezing in the past. She did not use her inhaler for her current symptoms. Daughter denies recent travel or sick contacts. She denies rashes, abdominal pain, nausea, vomiting or any other acute complaints.     A full history, including past medical, social, and family history (as detailed in this note), was reviewed and updated as necessary.    HISTORY:    Past Medical History    Diabetes mellitus (HCC)    Thyroid disease    Vertigo    No Known Allergies   Past Surgical History:  No date: Hysterectomy, abdominal, open   Current Outpatient Medications:     Atorvastatin (LIPITOR) 80 mg tablet, Take 80 mg by mouth every day.    Oxycodone (ROXICODONE) 5 mg Tablet, Take 1-2 tablets by mouth every 6 hours if needed for pain.    Pantoprazole (PROTONIX) 40 mg Delayed Release Tablet, Take 1 tablet by mouth every morning before a meal.   Social History     Tobacco Use    Smoking status: Never Smoker   Substance Use Topics     Alcohol use: Not on file    Drug use: Not on file     Social History     Social History Narrative    Not on file    No family history on file.        Review of Systems   Constitutional: Positive for fever. Negative for chills.   HENT: Negative for congestion and sore throat.    Eyes: Negative for visual disturbance.   Respiratory: Positive for cough. Negative for shortness of breath.    Cardiovascular: Negative for chest pain.   Gastrointestinal: Negative for abdominal pain, diarrhea, nausea and vomiting.   Genitourinary: Negative for difficulty urinating and dysuria.   Musculoskeletal: Positive for myalgias.   Skin: Negative for rash.   Neurological: Negative for headaches.   Psychiatric/Behavioral: Negative for confusion.          TRIAGE VITAL SIGNS:  Temp: 38 C (100.4 F) (05/10/18 1937)  Temp src: Oral (05/10/18 1937)  Pulse: 110 (05/10/18 1937)  BP: (!) 128/91 (05/10/18 1937)  Resp: 18 (05/10/18 1937)  SpO2: 97 % (05/10/18 1937)  Weight: 92.9 kg (204 lb 12.9 oz) (05/10/18 1937)    Physical Exam  Vitals signs and nursing note reviewed.   Constitutional:       General: She is not in  acute distress.     Appearance: Normal appearance. She is not ill-appearing, toxic-appearing or diaphoretic.   HENT:      Head: Normocephalic and atraumatic.      Right Ear: External ear normal.      Left Ear: External ear normal.      Nose: Nose normal.      Mouth/Throat:      Mouth: Mucous membranes are moist.   Eyes:      General:         Right eye: No discharge.         Left eye: No discharge.      Conjunctiva/sclera: Conjunctivae normal.      Pupils: Pupils are equal, round, and reactive to light.   Neck:      Musculoskeletal: Normal range of motion and neck supple.   Cardiovascular:      Rate and Rhythm: Normal rate and regular rhythm.      Pulses: Normal pulses.      Heart sounds: Normal heart sounds. No murmur. No friction rub. No gallop.    Pulmonary:      Effort: Pulmonary effort is normal. No respiratory distress.       Breath sounds: Normal breath sounds. No stridor. No wheezing, rhonchi or rales.      Comments: Dry cough on exam  Abdominal:      General: There is no distension.      Palpations: Abdomen is soft.      Tenderness: There is no abdominal tenderness.   Musculoskeletal: Normal range of motion.      Right lower leg: No edema.      Left lower leg: No edema.   Skin:     General: Skin is warm and dry.      Capillary Refill: Capillary refill takes less than 2 seconds.   Neurological:      General: No focal deficit present.      Mental Status: She is alert and oriented to person, place, and time.      Sensory: No sensory deficit.   Psychiatric:         Mood and Affect: Mood normal.         Behavior: Behavior normal.         Thought Content: Thought content normal.          INITIAL ASSESSMENT & PLAN, MEDICAL DECISION MAKING, ED COURSE  Michele Chung is a 58yr female who presents with a chief complaint of cough and fever.     Differential includes, but is not limited to: URI, asthma exacerbation, pneumonia, viral syndrome, ACS, PE, pharyngitis     The results of the ED evaluation were notable for the following:     Pertinent lab results:   Labs Reviewed   CBC WITH DIFFERENTIAL - Abnormal       Result Value    White Blood Cell Count 4.5      Red Blood Cell Count 4.62      Hemoglobin 13.9      Hematocrit 40.9      MCV 88.5      MCH 30.1      MCHC 34.0      RDW 13.6      MPV 9.0      Platelet Count 221      Neutrophils % Auto 36.0      Lymphocytes % Auto 44.5      Monocytes % Auto 17.3      Eosinophils % Auto  1.0      Basophils % Auto 1.2      Neutrophils Abs Auto 1.6 (*)     Lymphocytes Abs Auto 2.0      Monocytes Abs Auto 0.8      Eosinophils Abs Auto 0.0      Basophils Abs Auto 0.1     BASIC METABOLIC PANEL - Abnormal    Sodium 138      Potassium 4.2      Chloride 108      Carbon Dioxide Total 23 (*)     Urea Nitrogen, Blood (BUN) 10      Creatinine Serum 0.91      Glucose 111 (*)     Calcium 8.8      E-GFR, African American  (Female) 80      E-GFR, Non-African American (Female) 19      Narrative:     POTASSIUM: R-Slight Hemolysis, result may be affected,      URINALYSIS AND CULTURE IF IND - Abnormal    COLLECTION CLEAN CATCH      COLOR Yellow      CLARITY Clear      SPECIFIC GRAVITY, URINE 1.020      pH URINE 5.0      OCCULT BLOOD URINE Small (*)     BILIRUBIN URINE Negative      KETONES Negative      GLUCOSE URINE Negative      PROTEIN URINE Negative      UROBILINOGEN 2.0      NITRITE URINE Negative      LEUK ESTERASE Negative      MICROSCOPIC  Indicated      WBC, URINE 2      RBC 1      BACTERIA/HPF Many (*)     SQUAMOUS EPI 9 (*)     MUCOUS/LPF Few      URINE CULTURE INDICATED (*)    POC FLU TEST - Abnormal    POC FLU TEST Positive (*)     POC FLU TEST LOT#        Location & Machine ID       BLD GAS VENOUS - Abnormal    PATIENT TEMP, VEN        PO2, VEN <24 (*)     O2 SAT, VEN        PCO2, VEN 43      pH, VEN 7.37      HCO3, VEN 25      BASE EXCESS, VEN -1     LACTIC ACID, WHOLE BLD VENOUS - Normal    LACTIC ACID, WHOLE BLD VENOUS 1.3     LACTIC ACID, WHOLE BLD VENOUS - Normal    LACTIC ACID, WHOLE BLD VENOUS 1.2     CULTURE URINE, BACTI   POC GLUCOSE    POC GLUCOSE 95     BLD GAS VENOUS    PATIENT TEMP, VEN        PO2, VEN 43      O2 SAT, VEN 79      PCO2, VEN 36      pH, VEN 7.39      HCO3, VEN 22      BASE EXCESS, VEN -2       Pertinent imaging results (reviewed and interpreted independently by me):   DX CHEST 2 VIEWS: no pna    Radiology reads:   Chest Pa/lat    Result Date: 05/10/2018  DX CHEST 2 VIEWS EXAM DATE:  05/10/2018 10:05 PM COMPARISON: None. INDICATION: Signs/Symptoms:  fever FINDINGS: Normal heart size. No focal consolidation or pulmonary edema. No pleural effusion or pneumothorax. The visualized upper abdomen is unremarkable. No acute osseous abnormality. Mild degenerative changes of the thoracic spine. IMPRESSION: 1. No acute cardiopulmonary disease. I have personally reviewed the images of this study and agree with  the above report. Final Report Electronically Signed By: Danelle Berry on 05/10/2018 10:34 PM    EKG (reviewed and interpreted independently by me): sinus, rate 87, normal axis, normal intervals, no acute ischemic changes, no priors available         ED Medication Administration through 05/11/2018 0053     Date/Time Order Dose Route Action    05/10/2018 2245 Lactated Ringers Bolus 1,000 mL 1,000 mL IV New bag/syringe    05/10/2018 2244 Acetaminophen (TYLENOL) Tablet 1,000 mg 1,000 mg ORAL Given        Chart Review: no prior records to review.        PATIENT SUMMARY  58 year old female with history of diabetes presenting for evaluation of cough and fever. On arrival, febrile and tachycardic, but vital signs otherwise within normal limits. Dry cough on exam, but no focal lung findings. Neurologically intact and without signs to suggest meningitis.   UA without UTI. CBC without leukocytosis, leukopenia, or anemia.  VBG without CO2 retention. Provided with IV fluids and Tylenol.  Patient found to be influenza A positive. Given duration of symptoms greater than 2 days, Tamiflu not prescribed at this time. Not requiring supplemental O2. Tolerating p.o. without difficulty. Stable for discharge home with close follow-up with primary care provider.  Strict return precautions advised prior to discharge.       LAST VITAL SIGNS:  Temp: 36.8 C (98.2 F) (05/11/18 0008)  Temp src: Oral (05/11/18 0008)  Pulse: 91 (05/11/18 0008)  BP: 124/88 (05/11/18 0008)  Resp: 16 (05/11/18 0008)  SpO2: 98 % (05/11/18 0008)  Weight: 92.9 kg (204 lb 12.9 oz) (05/10/18 1937)    Clinical Impression:   Influenza A  Fever  Cough      Disposition: Discharge. Follow up with PMD. ED discharge instructions were reviewed and provided.          PATIENT'S GENERAL CONDITION:  Fair: Vital signs are stable and within normal limits. Patient is conscious but may be uncomfortable. Indicators are favorable.    SCRIBE STATEMENT  I, Marin Ewing, SCRIBE,  am  personally taking down the notes in the presence of Dr. Harle Stanford.  Electronically signed by Sallyanne Havers Ewing, SCRIBE, Scribe  05/10/2018  23:40    SCRIBE DISCLAIMER   I, Linward Foster, DO, personally performed the services described in this documentation, as scribed by the trained medical scribe above in my presence, and it is both accurate and complete.     Electronically signed by: Linward Foster, DO, Resident         This patient was seen, evaluated, and care plan was developed with the resident.  I agree with the findings and plan as outlined in our combined note.    Arnette Norris, MD      Electronically signed by: Arnette Norris, MD, Attending Physician

## 2018-05-10 NOTE — ED Nursing Note (Signed)
No Answer" note: The patient was called using the ED PA system. The patient was not found after a search of the ED Waiting Room Areas.

## 2018-05-10 NOTE — Discharge Instructions (Addendum)
Thank you for choosing [facility] for your emergency health care needs. It has been our privilege to take care of you today. Your primary complaints have been evaluated and you have been treated for your symptoms. At this time it is felt to be safe for you to return home. Please take all medicines that are prescribed to you as directed (see below). If you have a primary care physician, it is crucial that you follow up with him or her in the time frame recommended as many health conditions that seem self-limited initially may actually worsen over time.      - Please follow up with your primary care doctor in 2-3 days.  - Please drink plenty of fluids.   - It is very important to take all prescriptions as directed.  - For pain, take Acetaminophen or Ibuprofen as needed for pain. You may take 500-750mg  of Acetaminophen every 6 hours, but do not exceed more than 4 doses in a 24 hour period. You may take 400-800mg  of Ibuprofen every 6-8 hours.  - Return to the emergency department immediately if you experience any fevers greater than 100.4 Farenheit, difficulty breathing, worsening pain, nausea, vomiting, or diarrhea that does not go away, or have any other concerning symptoms.     If at any time you feel that your condition is worsening, call your doctor or return to the emergency department for reevaluation.    Please realize that the results of some studies that you had done during your stay with [place] (such as xrays and cultures) are only preliminarily resulted. Results of these studies may change as more information becomes available or as the studies are reevaluated by other members of our health care team in the next few days. We will attempt to contact you with any important changes or additions to the studies that were obtained today.  Additionally, some of the studies done during today's visit may not have results available for a few days. If you were told that a lab was drawn and that results would  be available at a future date you may call [phone] for these results, please wait 3 days before calling.      [facility]    [facility]  [address] [place]      [phone] (will fill [facility] Rx after hours)  [address], [facility]   [phone]    [facility]  [address]       [phone]  (until 11pm)    GENERAL PAIN MEDICATION PRECAUTIONS    You may have been prescribed medications for pain today. Some of these may contain a narcotic (Norco, Percocet, etc.) Take these pain medications as prescribed. Do not drink alcohol, drive, or operate heavy machinery while taking either narcotic or benzodiazepine medications. All narcotics and benzodiazepines have a risk of dependence, so please use with caution. If you were prescribed Norco or Percocet, do not take additional products containing Tylenol (acetaminophen), as this can cause an acetaminophen overdose and can damage your liver. Do not take more than 4000mg  of acetaminophen daily.    You may also have been prescribed an anti-inflammatory pain medication (NSAID) such as ibuprofen (Motrin, Advil) or naproxen (Aleve). Take the NSAID as prescribed, with food or milk. Stop taking the NSAID if you develop abdominal pain, vomiting blood or dark/tarry or bloody stools. Be sure to drink plenty of fluids, at least 1-2 liters of water per day while taking an  NSAID unless you have congestive heart failure or other condition that requires you to limit your water intake.    You may fill any prescriptions at a pharmacy of your choice.

## 2018-05-10 NOTE — ED Triage Note (Addendum)
[place] speaking pt accompanied by daughter reports fever, nausea, cough and body aches onset couple days ago has been taking otc meds niquil for sx.  CP only when she coughs.  Triage BG 95.  EKG complete

## 2018-05-11 DIAGNOSIS — R9431 Abnormal electrocardiogram [ECG] [EKG]: Secondary | ICD-10-CM

## 2018-05-11 DIAGNOSIS — R000 Tachycardia, unspecified: Secondary | ICD-10-CM

## 2018-05-12 LAB — ELECTROCARDIOGRAM WITH RHYTHM STRIP
QTC: 438
QTC: 460

## 2018-05-12 LAB — CULTURE URINE, BACTI: URINE CULTURE: NO GROWTH

## 2019-03-09 ENCOUNTER — Emergency Department (EMERGENCY_DEPARTMENT_HOSPITAL): Payer: No Typology Code available for payment source

## 2019-03-09 ENCOUNTER — Emergency Department
Admission: EM | Admit: 2019-03-09 | Discharge: 2019-03-09 | Disposition: A | Discharge status: Other Acute Inpatient Hospital | Payer: No Typology Code available for payment source | Attending: Emergency Medicine | Admitting: Emergency Medicine

## 2019-03-09 DIAGNOSIS — R05 Cough: Secondary | ICD-10-CM | POA: Insufficient documentation

## 2019-03-09 DIAGNOSIS — J1289 Other viral pneumonia: Secondary | ICD-10-CM | POA: Insufficient documentation

## 2019-03-09 DIAGNOSIS — R0902 Hypoxemia: Secondary | ICD-10-CM | POA: Insufficient documentation

## 2019-03-09 DIAGNOSIS — E119 Type 2 diabetes mellitus without complications: Secondary | ICD-10-CM | POA: Insufficient documentation

## 2019-03-09 DIAGNOSIS — Z79899 Other long term (current) drug therapy: Secondary | ICD-10-CM | POA: Insufficient documentation

## 2019-03-09 DIAGNOSIS — R509 Fever, unspecified: Secondary | ICD-10-CM | POA: Insufficient documentation

## 2019-03-09 DIAGNOSIS — E079 Disorder of thyroid, unspecified: Secondary | ICD-10-CM | POA: Insufficient documentation

## 2019-03-09 DIAGNOSIS — R110 Nausea: Secondary | ICD-10-CM | POA: Insufficient documentation

## 2019-03-09 DIAGNOSIS — U071 COVID-19: Secondary | ICD-10-CM | POA: Insufficient documentation

## 2019-03-09 DIAGNOSIS — R0602 Shortness of breath: Secondary | ICD-10-CM | POA: Insufficient documentation

## 2019-03-09 LAB — CBC WITH DIFFERENTIAL
Basophils % Auto: 0.1 %
Basophils Abs Auto: 0 10*3/uL (ref 0.0–0.2)
Eosinophils % Auto: 0 %
Eosinophils Abs Auto: 0 10*3/uL (ref 0.0–0.5)
Hematocrit: 37.2 % (ref 36.0–46.0)
Hemoglobin: 13.1 g/dL (ref 12.0–16.0)
Lymphocytes % Auto: 16.4 %
Lymphocytes Abs Auto: 1.3 10*3/uL (ref 1.0–4.8)
MCH: 30.1 pg (ref 27.0–33.0)
MCHC: 35.3 % (ref 32.0–36.0)
MCV: 85.3 fL (ref 80.0–100.0)
MPV: 7 fL (ref 6.8–10.0)
Monocytes % Auto: 6.7 %
Monocytes Abs Auto: 0.5 10*3/uL (ref 0.1–0.8)
Neutrophils % Auto: 76.8 %
Neutrophils Abs Auto: 5.9 10*3/uL (ref 1.8–7.7)
Platelet Count: 426 10*3/uL — ABNORMAL HIGH (ref 130–400)
RDW: 13.4 % (ref 0.0–14.7)
Red Blood Cell Count: 4.36 10*6/uL (ref 4.00–5.20)
White Blood Cell Count: 7.7 10*3/uL (ref 4.5–11.0)

## 2019-03-09 LAB — LACTIC ACID, WHOLE BLD VENOUS: Lactic Acid, Whole Bld Venous: 2.6 mmol/L — ABNORMAL HIGH (ref 0.9–1.7)

## 2019-03-09 LAB — BLD GAS VENOUS
Base Excess, Ven: 2 meq/L (ref ?–2)
HCO3, Ven: 26 meq/L (ref 20–28)
O2 Sat, Ven: 71 % (ref 70–100)
PCO2, Ven: 35 mm[Hg] (ref 35–50)
PO2, Ven: 37 mm[Hg] (ref 30–55)
pH, VEN: 7.48 — ABNORMAL HIGH (ref 7.30–7.40)

## 2019-03-09 LAB — LACTIC ACID, WHOLE BLD VENOUS REPEAT: Lactic Acid, Whole Bld Venous: 1.1 mmol/L (ref 0.9–1.7)

## 2019-03-09 LAB — BASIC METABOLIC PANEL
Calcium: 9.2 mg/dL (ref 8.6–10.5)
Carbon Dioxide Total: 20 mmol/L — ABNORMAL LOW (ref 24–32)
Chloride: 100 mmol/L (ref 95–110)
Creatinine Serum: 0.68 mg/dL (ref 0.44–1.27)
E-GFR Creatinine (Female): 95 mL/min/{1.73_m2}
E-GFR, African American (Female): >100 mL/min/{1.73_m2}
Glucose: 155 mg/dL — ABNORMAL HIGH (ref 70–99)
Potassium: 3.6 mmol/L (ref 3.3–5.0)
Sodium: 137 mmol/L (ref 135–145)
Urea Nitrogen, Blood (BUN): 10 mg/dL (ref 8–22)

## 2019-03-09 LAB — POC SARS-COV-2 + FLU A/B
POC Influenza A: NOT DETECTED
POC Influenza B: NOT DETECTED
POC SARS-COV-2: DETECTED — AB

## 2019-03-09 LAB — HIV AG/AB COMBO SCREEN: HIV Ag/Ab Combo Screen: NONREACTIVE

## 2019-03-09 LAB — TROPONIN T: TROPONIN T: 6 ng/L (ref <=19)

## 2019-03-09 LAB — D-DIMER
D-Dimer VTE Cut-off: 276 ng/mL DDU
D-Dimer: 391 ng/mL — ABNORMAL HIGH (ref 0–230)

## 2019-03-09 LAB — ED HCV SCREEN WITH REFLEX: Hepatitis C Ab Screen: NONREACTIVE

## 2019-03-09 MED ORDER — CEFTRIAXONE 1 GRAM SOLUTION FOR INJECTION
1.0000 g | Freq: Once | INTRAMUSCULAR | Status: AC
Start: 2019-03-09 — End: 2019-03-09
  Administered 2019-03-09: 14:00:00 1 g via INTRAVENOUS
  Filled 2019-03-09: qty 10

## 2019-03-09 MED ORDER — ACETAMINOPHEN 500 MG TABLET
1000.0000 mg | ORAL_TABLET | Freq: Once | ORAL | Status: AC
Start: 2019-03-09 — End: 2019-03-09
  Administered 2019-03-09: 14:00:00 1000 mg via ORAL
  Filled 2019-03-09: qty 2

## 2019-03-09 MED ORDER — LACTATED RINGERS IV BOLUS - DURATION REQ
1000.0000 mL | Freq: Once | INTRAVENOUS | Status: AC
Start: 2019-03-09 — End: 2019-03-09
  Administered 2019-03-09: 1000 mL via INTRAVENOUS

## 2019-03-09 MED ORDER — SODIUM CHLORIDE 0.9 % INTRAVENOUS SOLUTION
500.0000 mg | Freq: Once | INTRAVENOUS | Status: AC
Start: 2019-03-09 — End: 2019-03-09
  Administered 2019-03-09: 14:00:00 500 mg via INTRAVENOUS
  Filled 2019-03-09: qty 500

## 2019-03-09 MED ORDER — FENTANYL (PF) 50 MCG/ML INJECTION SOLUTION
50.0000 ug | Freq: Once | INTRAMUSCULAR | Status: DC
Start: 2019-03-09 — End: 2019-03-09

## 2019-03-09 NOTE — ED Provider Notes (Signed)
EMERGENCY DEPARTMENT PHYSICIAN NOTE - Michele Shingles         Date of Service:   No admission date for patient encounter. Patient's PCP: Herma Ard   Note Started: 03/09/2019 12:07 DOB: 1960-12-26         Chief Complaint   Patient presents with    Shortness Of Breath    COVID-19       The history provided by the patient and relative.  Interpreter used: No    Michele Chung is a 58yr old female, who  has a past medical history of Diabetes mellitus, Thyroid disease, and Vertigo., presenting to the ED as a with a chief complaint of chest pain and SOB that began 2 weeks ago and acutely worsened today. Patient endorses a fever 5 days ago but has resolved. She also reports cough, myalgias, bilateral knee pain, and nausea. No vomiting, diarrhea, abdominal pain or headache. Patient is followed cardiologist Dr. Maisie Fus, but she hasn't seen her in the "years".    A full history, including past medical, social, and family history (as detailed in this note), was reviewed and updated as necessary.    HISTORY:    Past Medical History    Diabetes mellitus    Thyroid disease    Vertigo    No Known Allergies   Past Surgical History:  No date: Hysterectomy, abdominal, open   Current Outpatient Medications:     Atorvastatin (LIPITOR) 80 mg tablet, Take 80 mg by mouth every day.    Oxycodone (ROXICODONE) 5 mg Tablet, Take 1-2 tablets by mouth every 6 hours if needed for pain.    Pantoprazole (PROTONIX) 40 mg Delayed Release Tablet, Take 1 tablet by mouth every morning before a meal.   Social History     Tobacco Use    Smoking status: Never Smoker   Substance Use Topics    Alcohol use: Not on file    Drug use: Not on file     Social History     Social History Narrative    Not on file    No family history on file.        Review of Systems   Constitutional: Positive for fever. Negative for chills.   HENT: Negative for rhinorrhea and sore throat.    Eyes: Negative for visual disturbance.   Respiratory: Positive for  cough and shortness of breath.    Cardiovascular: Positive for chest pain.   Gastrointestinal: Positive for nausea. Negative for abdominal pain, diarrhea and vomiting.   Genitourinary: Negative for difficulty urinating and dysuria.   Musculoskeletal: Positive for arthralgias and myalgias.   Skin: Negative for rash.   Neurological: Negative for headaches.   Psychiatric/Behavioral: Negative for confusion.          TRIAGE VITAL SIGNS:  Temp: 37.4 C (99.3 F) (03/09/19 1210)  Temp src: Oral (03/09/19 1210)  Pulse: 110 (03/09/19 1203)  BP: 162/88 (03/09/19 1210)  Resp: (!) 34 (03/09/19 1203)  SpO2: (!) 84 % (03/09/19 1203)  Weight: 89.1 kg (196 lb 6.9 oz) (03/09/19 1217)    Physical Exam  Vitals signs and nursing note reviewed.   Constitutional:       General: She is not in acute distress.     Appearance: Normal appearance. She is not ill-appearing, toxic-appearing or diaphoretic.      Comments: Warm to touch, patient was lethargic but improved with supplemental oxygen   HENT:      Head: Normocephalic and atraumatic.  Right Ear: External ear normal.      Left Ear: External ear normal.   Eyes:      Conjunctiva/sclera: Conjunctivae normal.   Neck:      Musculoskeletal: Normal range of motion and neck supple.   Cardiovascular:      Rate and Rhythm: Normal rate and regular rhythm.      Pulses: Normal pulses.      Heart sounds: Normal heart sounds. No murmur. No friction rub. No gallop.    Pulmonary:      Effort: Pulmonary effort is normal. No respiratory distress.      Breath sounds: Normal breath sounds. No stridor. No wheezing, rhonchi or rales.   Abdominal:      General: There is no distension.      Palpations: Abdomen is soft.      Tenderness: There is no abdominal tenderness.   Musculoskeletal:      Right lower leg: No edema.      Left lower leg: No edema.   Neurological:      Mental Status: She is alert.   Psychiatric:         Mood and Affect: Mood normal.         Thought Content: Thought content normal.             INITIAL ASSESSMENT & PLAN, MEDICAL DECISION MAKING, ED COURSE  Michele Chung is a [age]yr female who presents with a chief complaint of chest pain and SOB.     Differential includes, but is not limited to: PE, dissection, pna, atypical pna, COVID-19     The results of the ED evaluation were notable for the following:     Pertinent lab results:   Labs Reviewed   LACTIC ACID, WHOLE BLD VENOUS - Abnormal       Result Value    LACTIC ACID, WHOLE BLD VENOUS 2.6 (*)    BLD GAS VENOUS - Abnormal    PATIENT TEMP, VEN        PO2, VEN 37      O2 SAT, VEN 71      PCO2, VEN 35      pH, VEN 7.48 (*)     HCO3, VEN 26      BASE EXCESS, VEN 2     CBC WITH DIFFERENTIAL   BASIC METABOLIC PANEL   ED HCV SCREEN WITH REFLEX   D-DIMER   TROPONIN T   HIV AG/AB COMBO SCREEN   LACTIC ACID, WHOLE BLD VENOUS REPEAT     Radiology reads:   None      EKG (reviewed and interpreted independently by me): normal sinus rhythm with normal intervals and no acute ischemic changes  Chart Review: I reviewed the patient's prior medical records. Pertinent information that is relevant to this encounter - patient had an encounter in Feb 2020 for Influenza A.          PATIENT SUMMARY  [age]yr female presents to the ED with two weeks of cough, fever, sob and nausea. Patient initially lethargic and hypoxic, both improved with 15 L NRB.  CXR showing bilateral opacities. COVID-19 test positive. Initial lactate elevated, improved with IVF. Treated with antibiotics for possible overlying pna. Plan to transfer to [facility] for further management of covid pna.            LAST VITAL SIGNS:  Temp: 37.4 C (99.3 F) (03/09/19 1210)  Temp src: Oral (03/09/19 1210)  Pulse: 108 (03/09/19 1210)  BP: 162/88 (03/09/19  1210)  Resp: 16 (03/09/19 1210)  SpO2: 95 % (03/09/19 1210)  Weight: 89.1 kg (196 lb 6.9 oz) (03/09/19 1217)      Clinical Impression: COVID-19 pneumonia, hypoxia    Disposition: Transfer to [place]:  Fair: Vital signs are stable and within normal limits. Patient is conscious but may be uncomfortable. Indicators are favorable.    [employer],  am personally taking down the notes in the presence of Dr. Gates Rigg.  Electronically signed by Gillis Ends Ewing, SCRIBE, Scribe  03/09/2019  12:07    SCRIBE DISCLAIMER  I, Kendrick Fries, MD, personally performed the services described in this documentation, as scribed by the trained medical scribe above in my presence, and it is both accurate and complete.     Electronically signed by: Kendrick Fries, MD - Attending Physician

## 2019-03-09 NOTE — ED Nursing Note (Signed)
Pt remains a/o, NAD.  NRB weaned down to 10L/min.  Will con't to wean as tolerated.

## 2019-03-09 NOTE — ED Nursing Note (Addendum)
Patient is being transferred to [facility] via [facility] 244 report to [place]. Report given to irene at [phone]. Patient alert and oriented X4, respirations even and unlabored, in no respiratory distress or distress noted with patient, on oxygen at 6 liters per minute via nasal canula, skin warm and dry, IV dry and intact, saline lock to left antecubital. Belongings went with the patient.

## 2019-03-09 NOTE — ED Nursing Note (Signed)
Assume care report received from Stephanie,RN

## 2019-03-09 NOTE — ED Nursing Note (Signed)
Pt to R1 as 944 SOB. C/o SOB and cp. Per triage nurse, RA SpO2=84%. Pt [place] speaking.

## 2019-03-09 NOTE — ED Nursing Note (Signed)
First contact with pt.  Pt russian speaking, son at bedside to translate.  Second blood culture to be obtained and iv abx to be given.

## 2019-03-14 LAB — CULTURE BLOOD, BACTI (INCLUDES YEAST): CULTURE BLOOD: NO GROWTH

## 2019-03-20 LAB — ELECTROCARDIOGRAM WITH RHYTHM STRIP: QTC: 439

## 2023-04-17 IMAGING — DX LUMBAR SPINE AP, LAT WITH FLEXION AND EXTEN
4 series · 4 of 4 positions shown · non-contrast
Comparison: None

________________________________________________________________________________________________ 
LUMBAR SPINE AP, LAT WITH FLEXION AND EXTEN, 04/17/2023 [DATE]: 
CLINICAL INDICATION: Back pain. Radiculopathy, lumbar region. Radiculopathy, 
thoracic region

[AP]
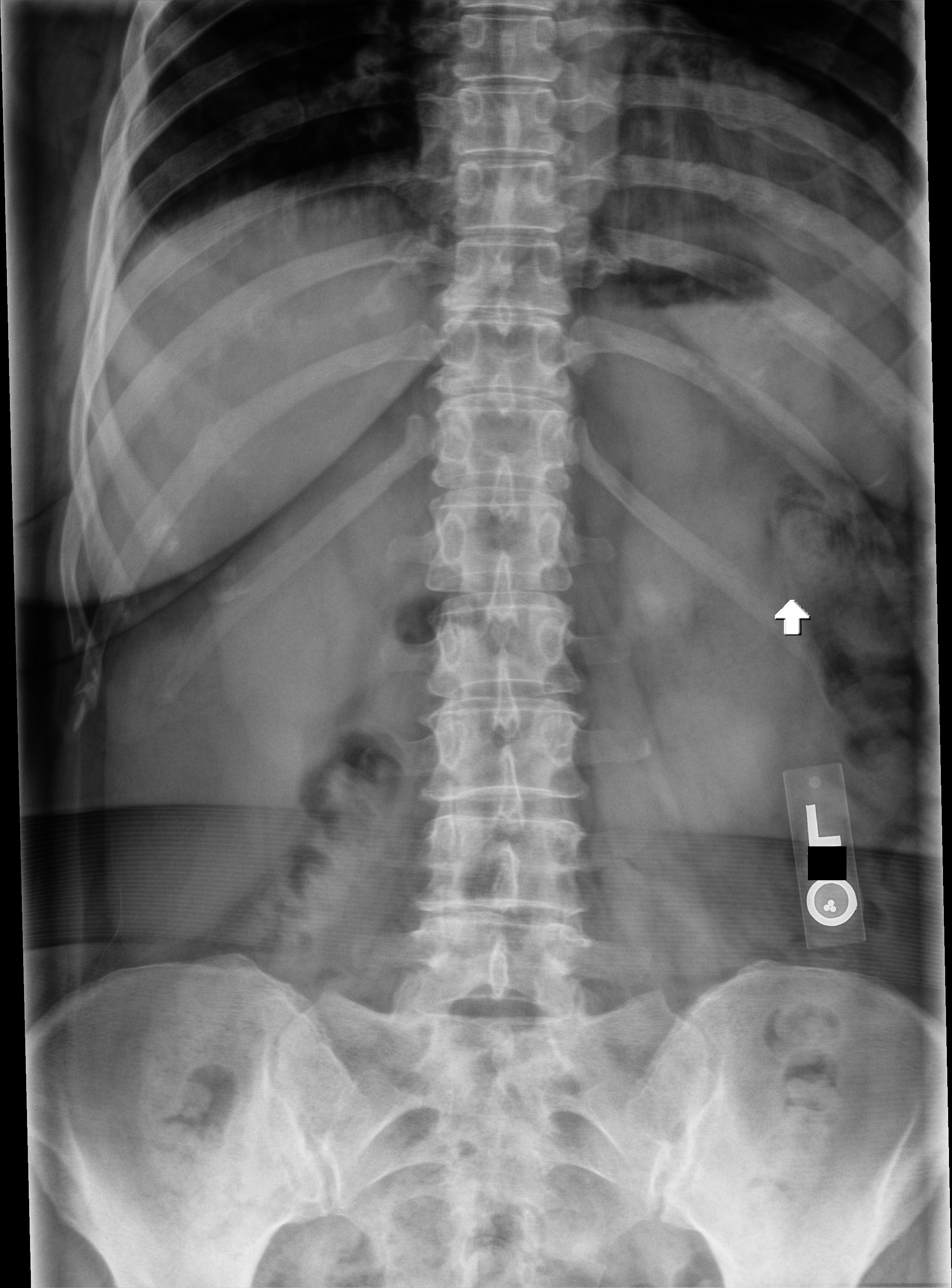

[lateral]
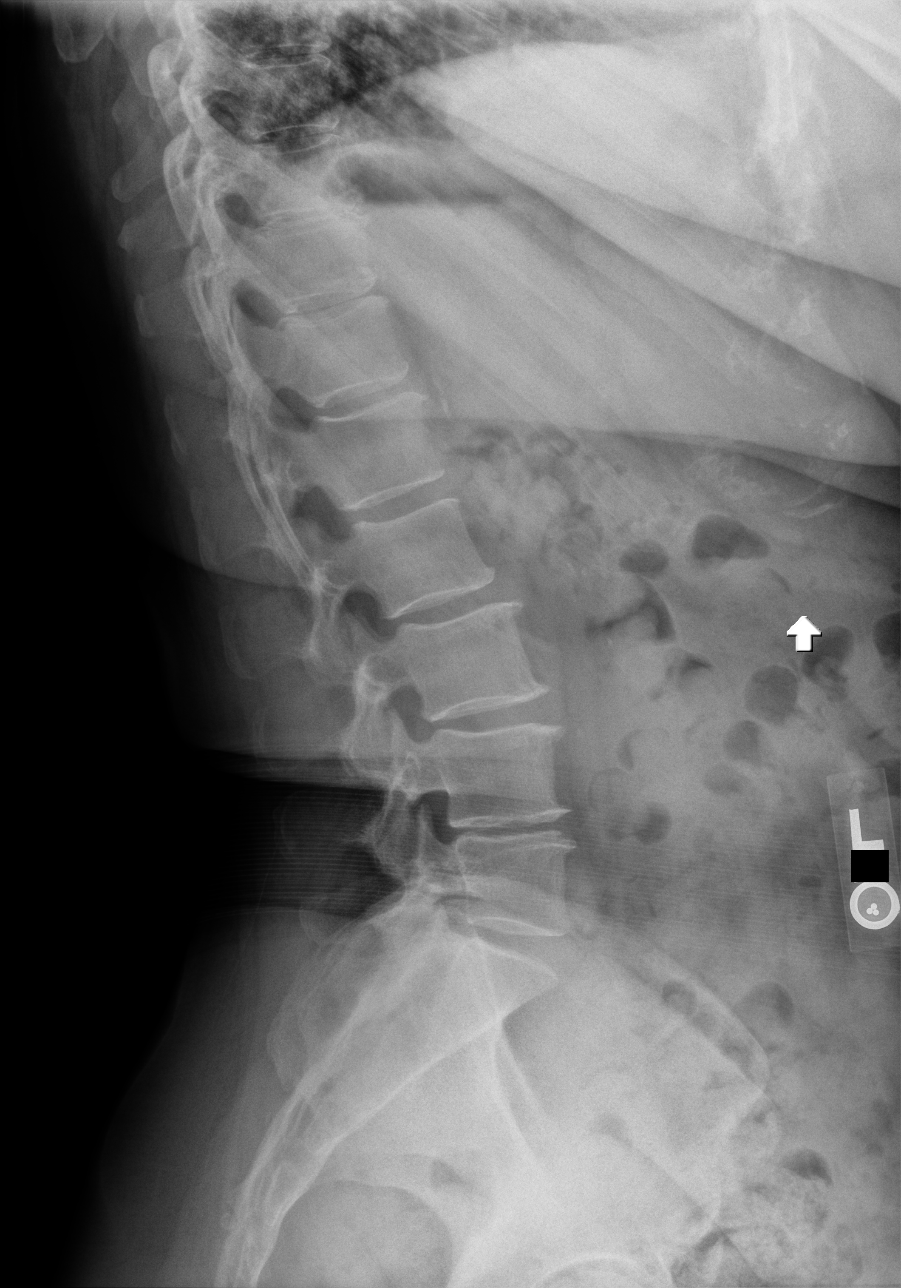

[lateral flex]
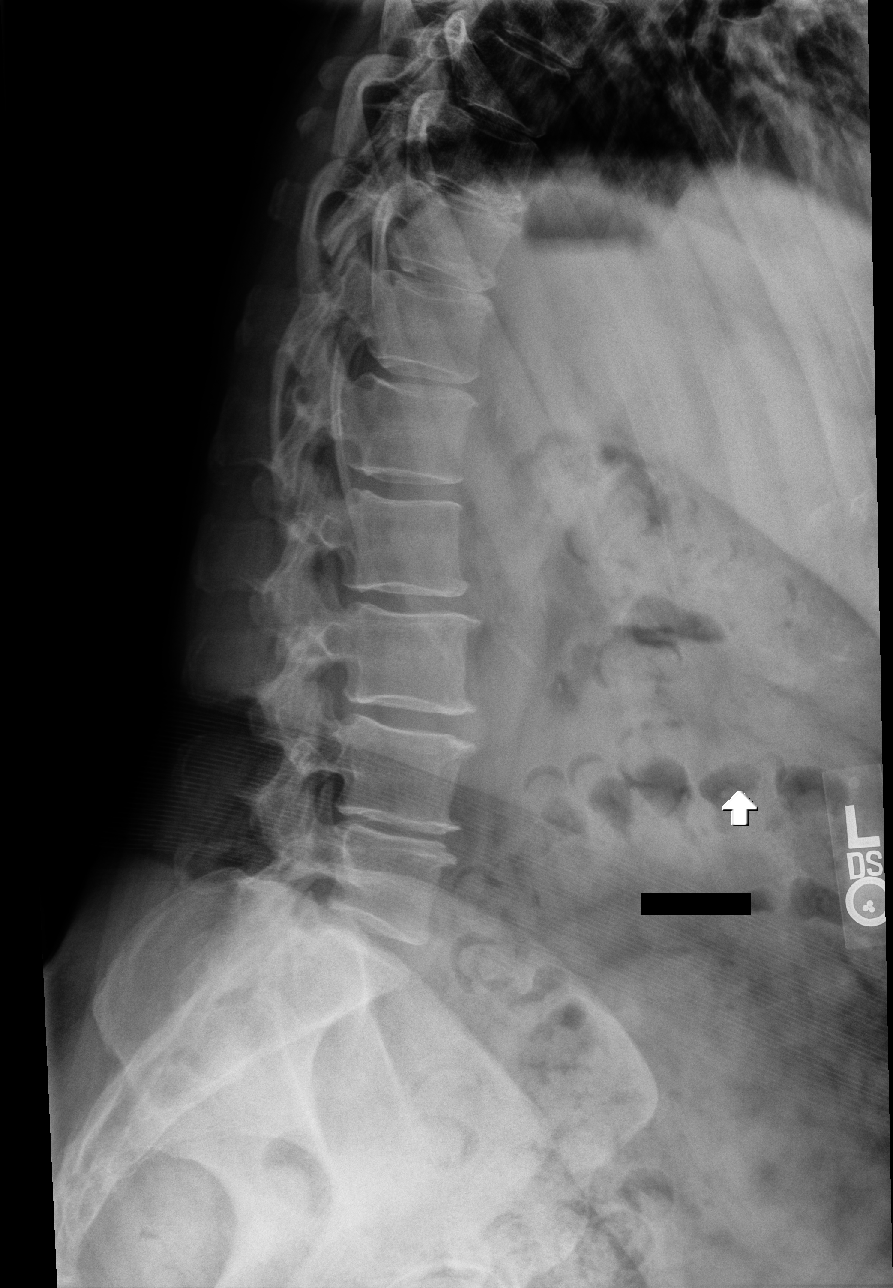

[lateral ext]
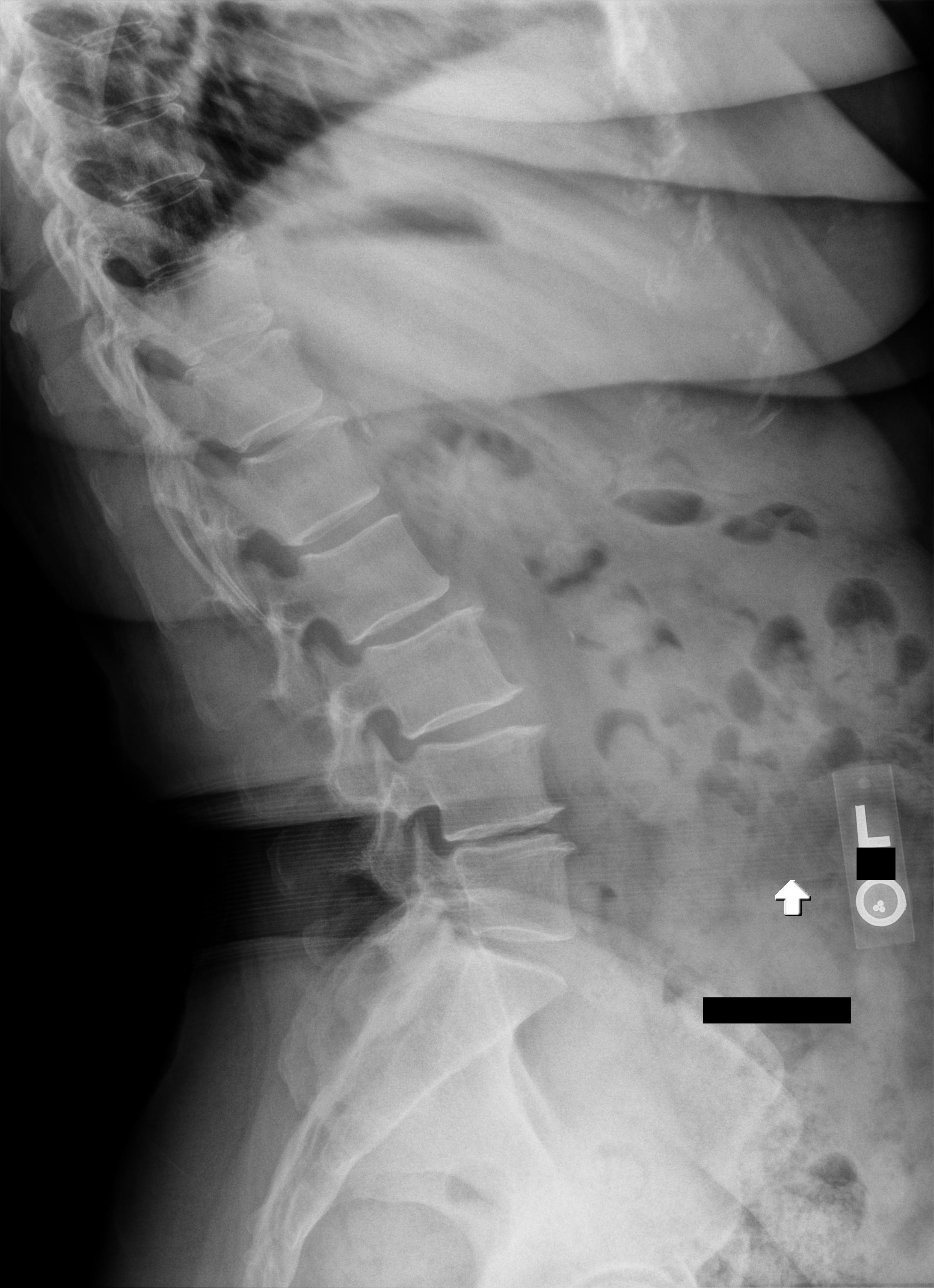

[4 of 4 positions shown; findings below may reference images not displayed]

FINDINGS: No fracture, subluxation or dynamic instability. Moderate L4-5 and 
mild multifocal disc space narrowing. Small marginal osteophytes. Lower 
lumbar/lumbosacral facet arthropathy. Degenerative change of the SI joints. 
Atherosclerosis.
IMPRESSION: 1.  Degenerative change.  
2.  No dynamic instability.

## 2023-05-04 IMAGING — MR MRI LUMBAR SPINE WITHOUT CONTRAST
4 of 8 series · 6 of 48 positions shown · non-contrast
Comparison: 04/17/2023 lumbar spine radiographs

________________________________________________________________________________________________ 
MRI THORACIC SPINE WITHOUT CONTRAST, MRI LUMBAR SPINE WITHOUT CONTRAST, 05/04/2023 
[DATE]: 
CLINICAL INDICATION: Radiculopathy, lumbar region. Radiculopathy, thoracic 
region
TECHNIQUE: Multiplanar, multiecho position MR images of the thoracic and lumbar 
spine were performed without contrast. Patient was scanned on a 3T magnet.

[Series 101: survey · axial · 10.0mm · 1.39mm/px · 1 of 9 slices shown]
[im 1/9]
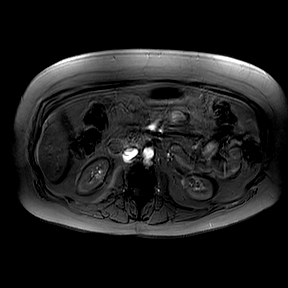

[Series 201: t2w_cor-surv · coronal · 6.0mm · 0.50mm/px · 1 of 6 slices shown]
[im 1/6]
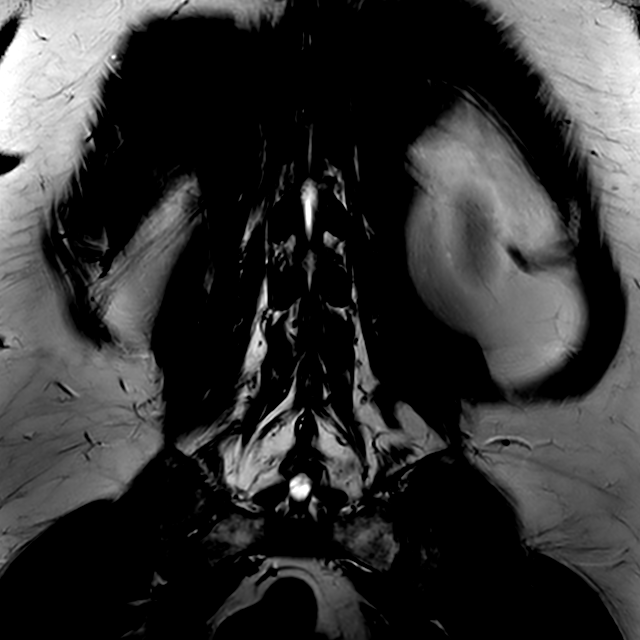

[Series 301: t1w_tse sag · sagittal · 4.0mm · 0.25mm/px · 2 of 17 slices shown]
[im 1/17]
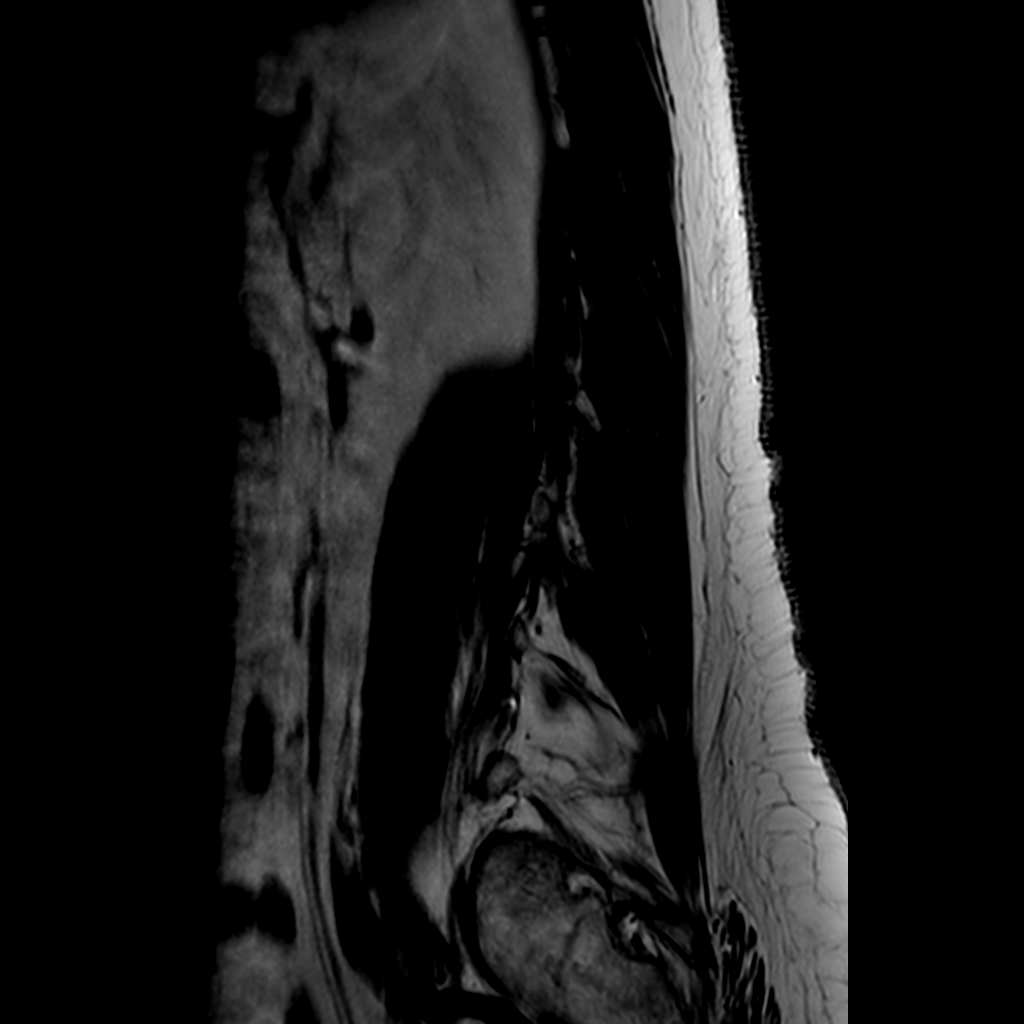
[im 17/17]
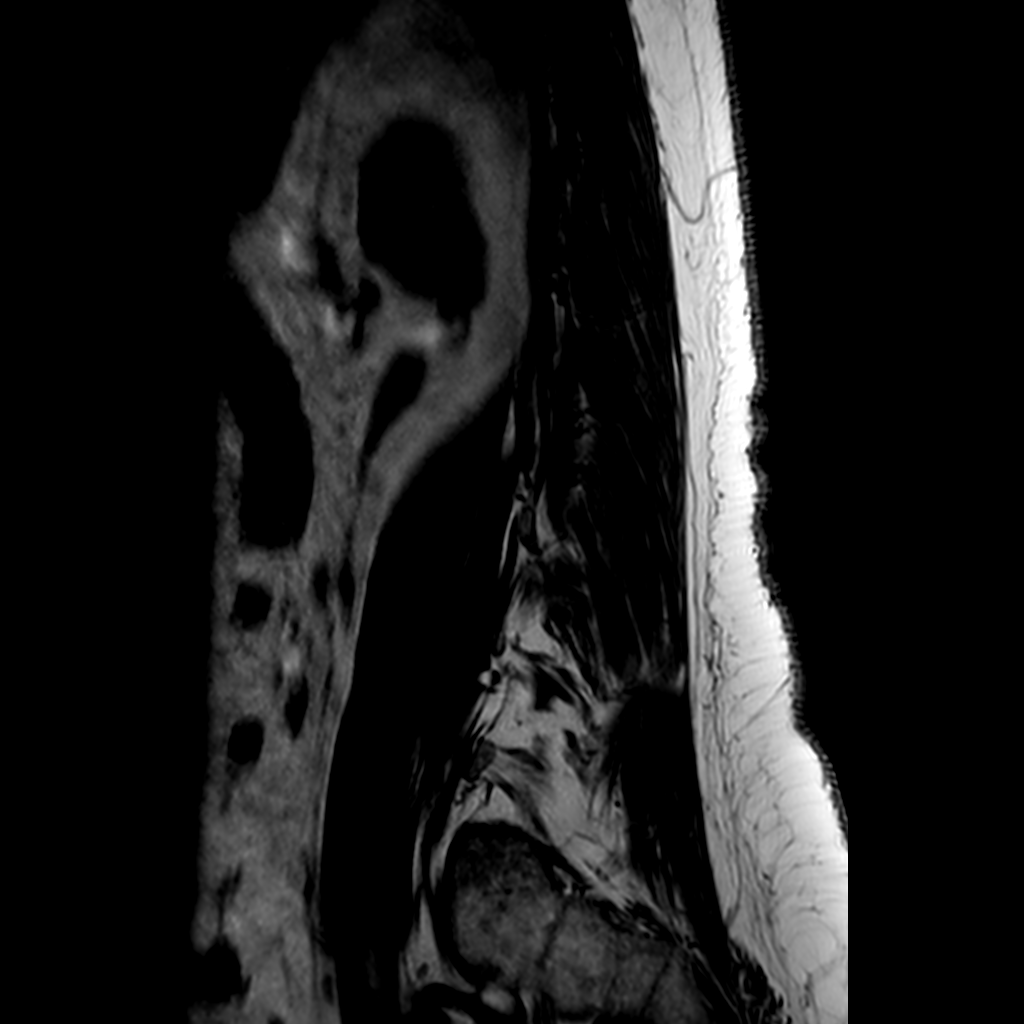

[Series 401: t2w_tse sag · sagittal · 4.0mm · 0.26mm/px · 2 of 17 slices shown]
[im 1/17]
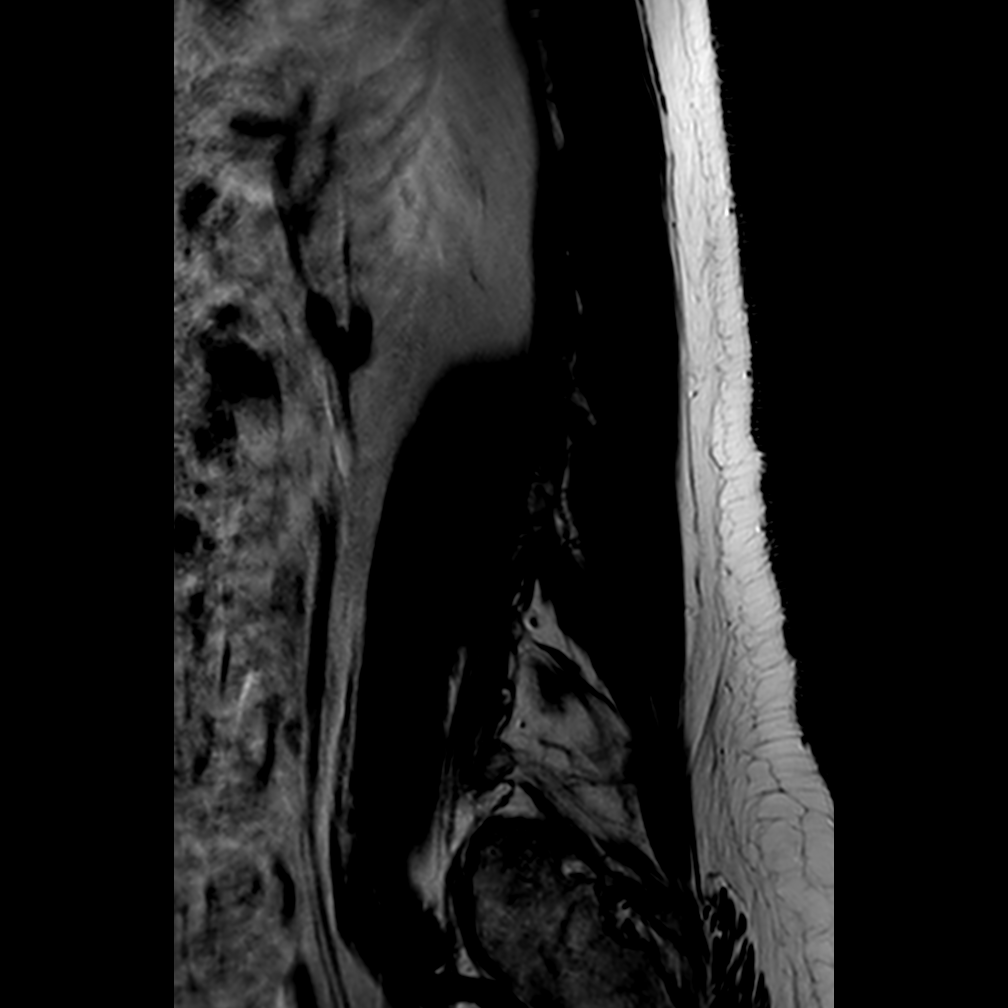
[im 9/17]
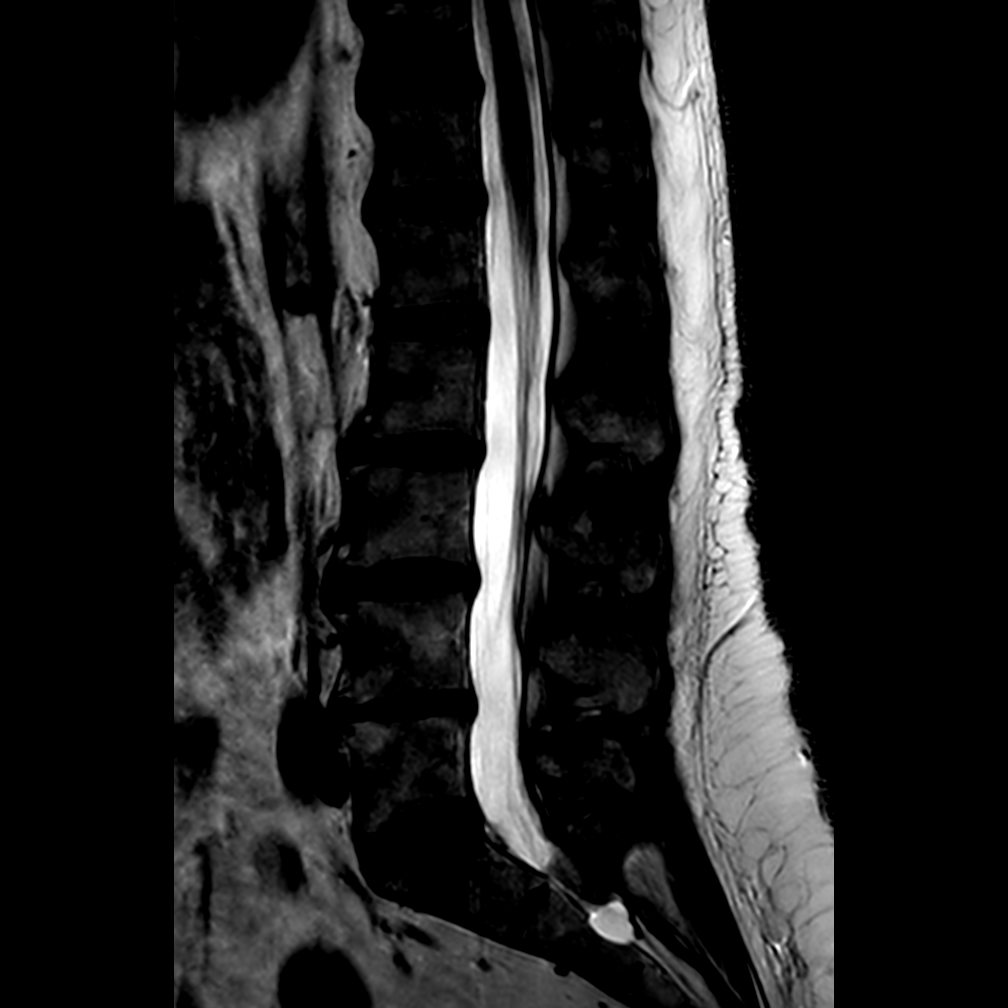

[6 of 48 positions shown; findings below may reference images not displayed]

FINDINGS: VERTEBRA: The vertebral bodies are normal in height and alignment. No acute 
vertebral body fracture. No spondylolisthesis. No marrow replacing lesions.  
SPINAL CORD: Mild flattening of the ventral cord at the level of the small T6-7 
central disc protrusion. Spinal cord is normal in caliber and signal intensity. 
Conus medullaris is at the level of L1.  
THORACIC DISCS: Multilevel degenerative disc disease. Small T6-7/T7-8 central 
disc protrusions with mild flattening of the ventral cord at the level of T6-7. 
Mild disc bulges. No thoracic spinal stenosis. 
Modic I-II: Mild type I Modic changes at T11-12. 
Ligamentum Flavum > 2.5 mm: All lumbar levels. 
T12-L1: The disc is normal in height and signal. No disc herniation. Normal 
facets. No spinal canal or neural foraminal stenosis. 
L1-L2: The disc is normal in height and signal. No disc herniation. Normal 
facets. No spinal canal or neural foraminal stenosis. 
L2-L3: The disc is normal in height and signal. No disc herniation. Normal 
facets. No spinal canal or neural foraminal stenosis. 
L3-L4: The disc is normal in height and signal. No disc herniation. Normal 
facets. No spinal canal or neural foraminal stenosis. 
L4-L5: The disc is normal in height and signal. No disc herniation. Normal 
facets. No spinal canal or neural foraminal stenosis. 
L5-S1: The disc is normal in height and signal. No disc herniation. Normal 
facets. No spinal canal or neural foraminal stenosis. 
OTHER: Localizer view demonstrates multinodular thyroid and degenerative change 
of the cervical spine. The aorta is normal in diameter. The posterior paraspinal 
soft tissues are negative.
IMPRESSION: 1.  Multilevel degenerative change. 
2.  Small T6-7/T7-8 central disc protrusions with mild flattening of the ventral 
cord at the level of T6-7. 
3.  Multinodular thyroid: Thyroid sonogram may be helpful for further 
evaluation, if clinically indicated.

## 2023-05-04 IMAGING — MR MRI THORACIC SPINE WITHOUT CONTRAST
5 of 8 series · 18 of 48 positions shown · non-contrast
Comparison: 04/17/2023 lumbar spine radiographs

________________________________________________________________________________________________ 
MRI THORACIC SPINE WITHOUT CONTRAST, MRI LUMBAR SPINE WITHOUT CONTRAST, 05/04/2023 
[DATE]: 
CLINICAL INDICATION: Radiculopathy, lumbar region. Radiculopathy, thoracic 
region
TECHNIQUE: Multiplanar, multiecho position MR images of the thoracic and lumbar 
spine were performed without contrast. Patient was scanned on a 3T magnet.

[Series 102: T1 · sagittal · 5.5mm · 0.87mm/px · 2 of 15 slices shown]
[im 1/15]
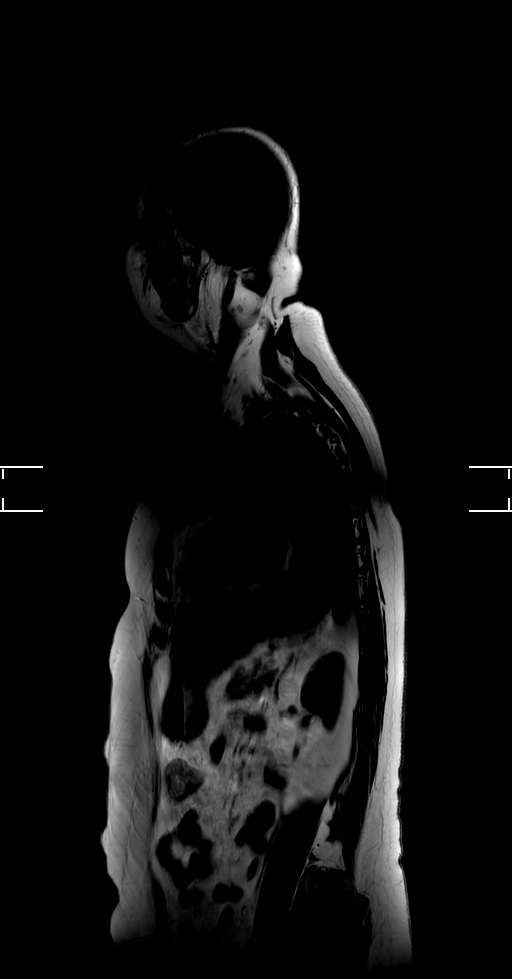
[im 15/15]
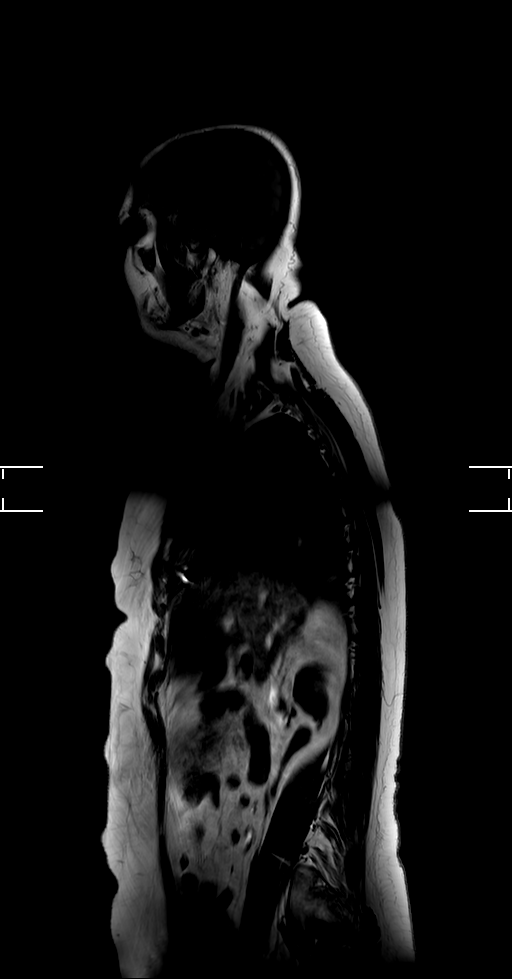

[Series 201: t2w_tse cor · coronal · 6.0mm · 0.51mm/px · 2 of 11 slices shown]
[im 1/11]
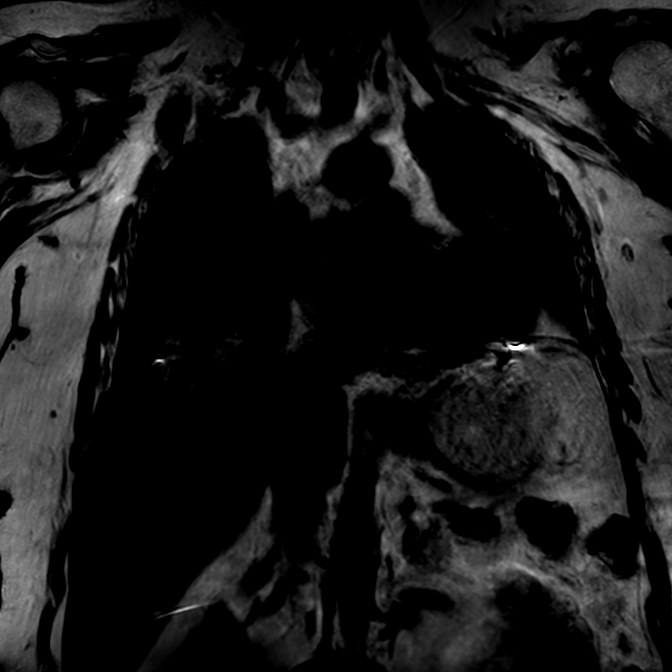
[im 11/11]
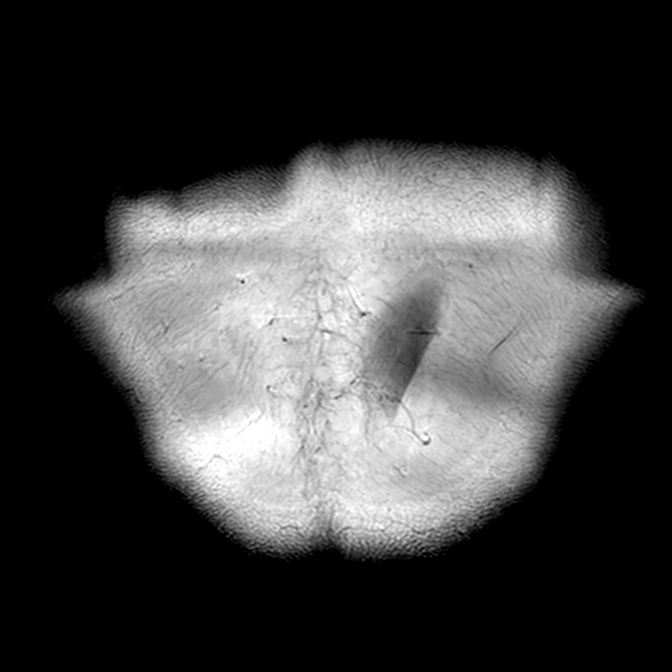

[Series 301: t1w_tse sag · sagittal · 3.0mm · 0.66mm/px · 3 of 21 slices shown]
[im 1/21]
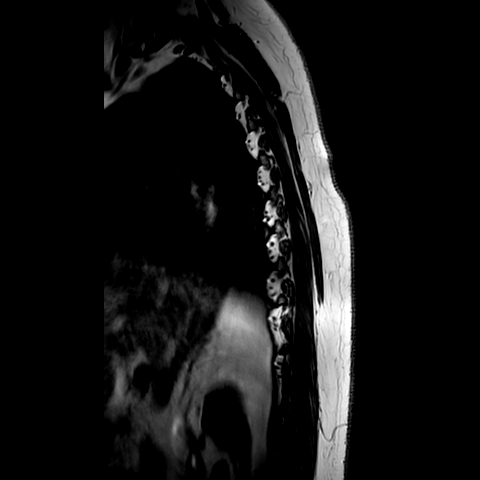
[im 11/21]
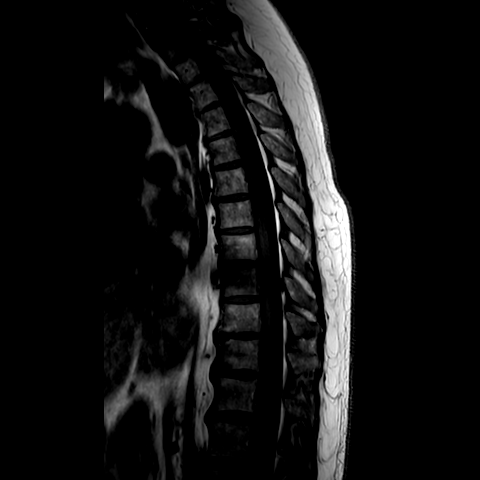
[im 21/21]
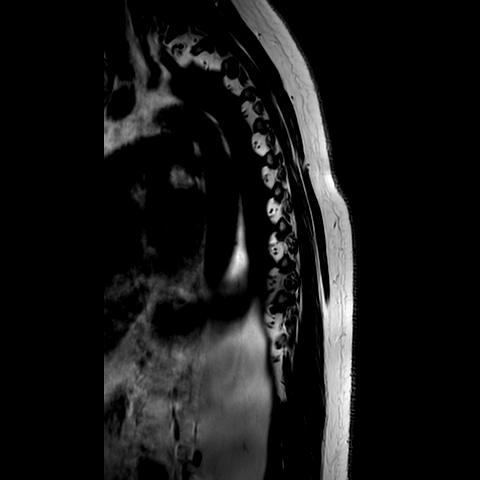

[Series 401: t2w_tse sag · sagittal · 3.0mm · 0.62mm/px · 2 of 21 slices shown]
[im 1/21]
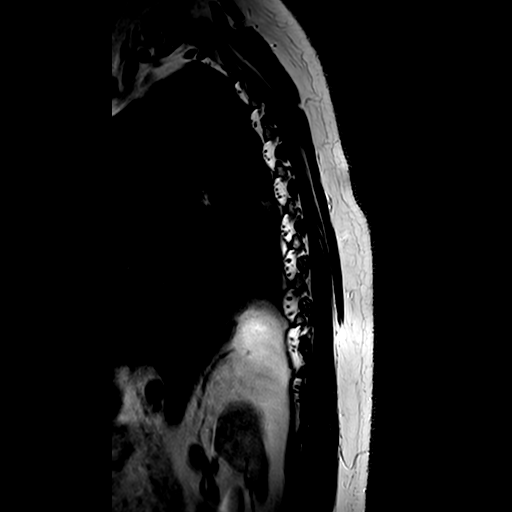
[im 11/21]
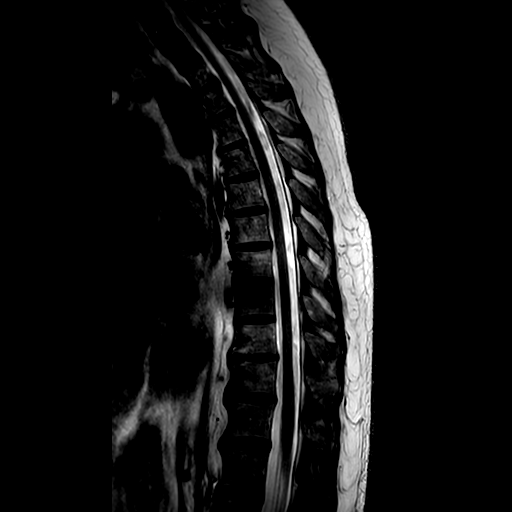

[Series 703: T2 · axial · 4.0mm · 0.42mm/px · z∈[-279,-31]mm · 9 of 66 slices shown]
[im 1/66]
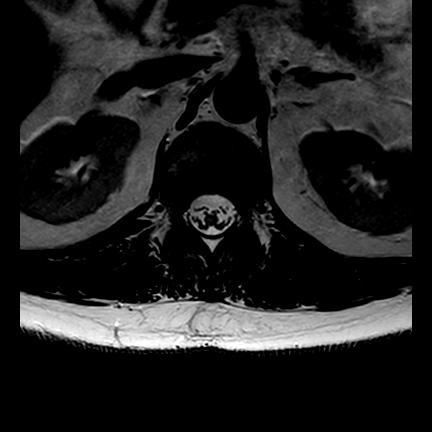
[im 9/66]
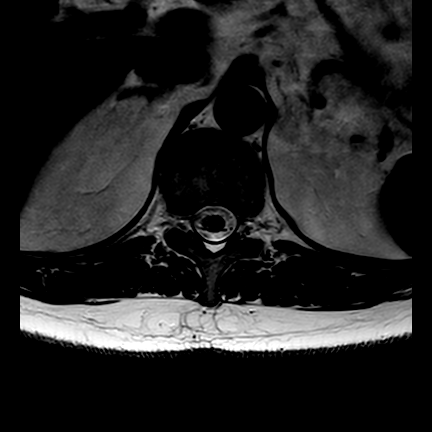
[im 17/66]
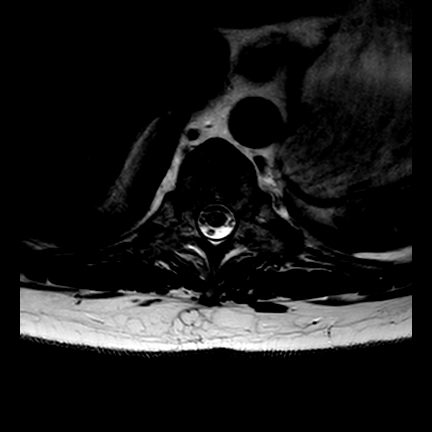
[im 25/66]
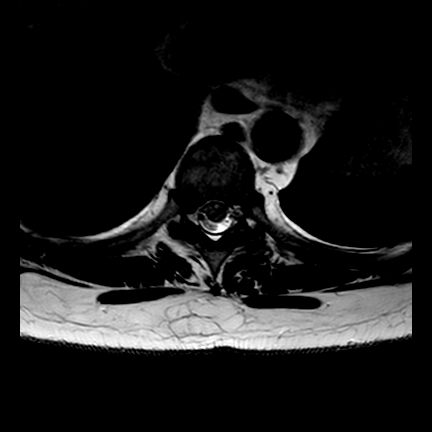
[im 33/66]
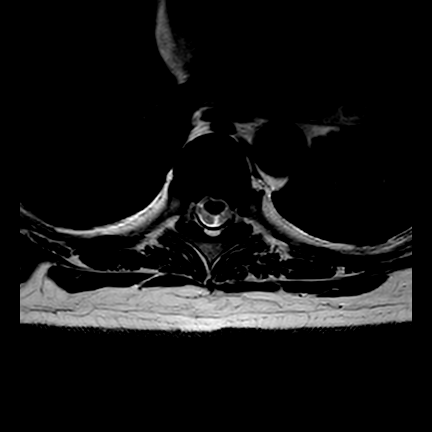
[im 41/66]
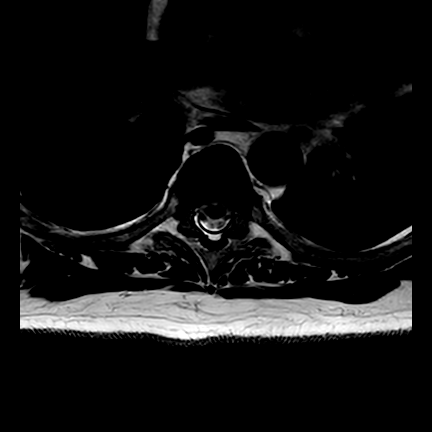
[im 49/66]
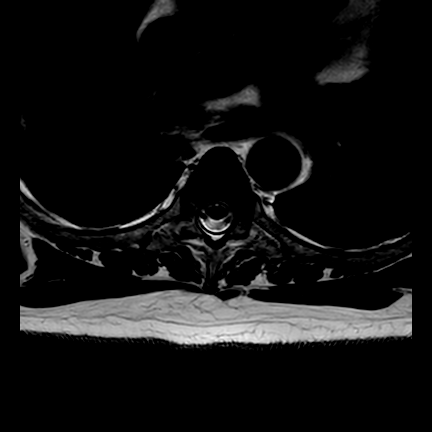
[im 57/66]
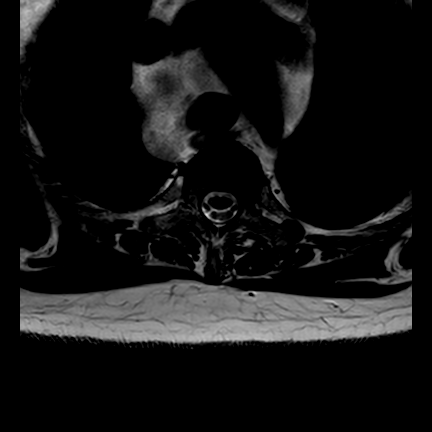
[im 66/66]
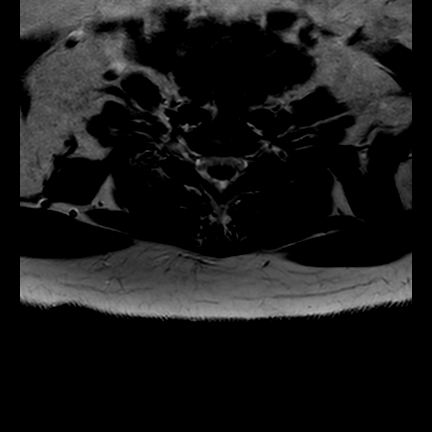

[18 of 48 positions shown; findings below may reference images not displayed]

FINDINGS: VERTEBRA: The vertebral bodies are normal in height and alignment. No acute 
vertebral body fracture. No spondylolisthesis. No marrow replacing lesions.  
SPINAL CORD: Mild flattening of the ventral cord at the level of the small T6-7 
central disc protrusion. Spinal cord is normal in caliber and signal intensity. 
Conus medullaris is at the level of L1.  
THORACIC DISCS: Multilevel degenerative disc disease. Small T6-7/T7-8 central 
disc protrusions with mild flattening of the ventral cord at the level of T6-7. 
Mild disc bulges. No thoracic spinal stenosis. 
Modic I-II: Mild type I Modic changes at T11-12. 
Ligamentum Flavum > 2.5 mm: All lumbar levels. 
T12-L1: The disc is normal in height and signal. No disc herniation. Normal 
facets. No spinal canal or neural foraminal stenosis. 
L1-L2: The disc is normal in height and signal. No disc herniation. Normal 
facets. No spinal canal or neural foraminal stenosis. 
L2-L3: The disc is normal in height and signal. No disc herniation. Normal 
facets. No spinal canal or neural foraminal stenosis. 
L3-L4: The disc is normal in height and signal. No disc herniation. Normal 
facets. No spinal canal or neural foraminal stenosis. 
L4-L5: The disc is normal in height and signal. No disc herniation. Normal 
facets. No spinal canal or neural foraminal stenosis. 
L5-S1: The disc is normal in height and signal. No disc herniation. Normal 
facets. No spinal canal or neural foraminal stenosis. 
OTHER: Localizer view demonstrates multinodular thyroid and degenerative change 
of the cervical spine. The aorta is normal in diameter. The posterior paraspinal 
soft tissues are negative.
IMPRESSION: 1.  Multilevel degenerative change. 
2.  Small T6-7/T7-8 central disc protrusions with mild flattening of the ventral 
cord at the level of T6-7. 
3.  Multinodular thyroid: Thyroid sonogram may be helpful for further 
evaluation, if clinically indicated.
